# Patient Record
Sex: Male | Born: 1945 | Race: White | Hispanic: Yes | Marital: Married | Smoking: Never smoker
Health system: Southern US, Community
[De-identification: ages and names within clinical notes are randomized; demographics above are authoritative.]

---

## 2021-09-16 ENCOUNTER — Other Ambulatory Visit: Payer: Self-pay

## 2021-09-16 ENCOUNTER — Ambulatory Visit: Payer: 59 | Admitting: Physician Assistant

## 2021-09-16 VITALS — BP 131/80 | HR 74 | Wt 152.0 lb

## 2021-09-16 DIAGNOSIS — F0150 Vascular dementia without behavioral disturbance: Secondary | ICD-10-CM | POA: Diagnosis not present

## 2021-09-16 DIAGNOSIS — R413 Other amnesia: Secondary | ICD-10-CM

## 2021-09-16 DIAGNOSIS — F0280 Dementia in other diseases classified elsewhere without behavioral disturbance: Secondary | ICD-10-CM | POA: Insufficient documentation

## 2021-09-16 DIAGNOSIS — G309 Alzheimer's disease, unspecified: Secondary | ICD-10-CM | POA: Diagnosis not present

## 2021-09-16 MED ORDER — MEMANTINE HCL 10 MG PO TABS: 10.0000 mg | ORAL_TABLET | Freq: Two times a day (BID) | ORAL | 11 refills | Status: DC

## 2021-09-16 NOTE — Progress Notes (Signed)
Assessment/Plan:   Kenneth Nixon is a very pleasant 76 y.o. year old RH male with  a history of hypertension, adenocarcinoma of the prostate, PAF, hyperlipidemia,seen today for evaluation of memory loss. MoCA today is 14/30 with deficiencies in most areas, delayed recall 0/5.  Orientation 2/6.  Visuospatial executive 0/5.  The patient recently moved from [place] to here already carrying a diagnosis of dementia for the last 4 years.  No details of his dementia are available for review.  He was on memantine 10 mg daily until now.   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  Discussed the importance of regular daily schedule to maintain brain function.  Continue to monitor mood with PCP.  Recommend establishing appointments with cardiologist and with urology for continuation of care by his PCP Stay active at least 30 minutes at least 3 times a week.  Naps should be scheduled and should be no longer than 60 minutes and should not occur after 2 PM.  Control cardiovascular risk factors  Mediterranean diet is recommended  Folllow up in 3 months  Subjective:    The patient is seen in neurologic consultation at the request of Stana Bunting, PA-C for the evaluation of memory.  The patient is accompanied by his wife who supplements the history. This is a 76 y.o. year old RH  male who recently moved in December 2022 from [place] to live with his son.he carries a diagnosis of dementia for at least 2 years.  Initially he reports that his memory problems become around 40 years ago, when he was having difficulties remembering conversations or recent events.  His memory issues became worse 2 years ago, for which  his neurologist started him on memantine 10 mg daily, which he tolerated well.  Since arriving here, his son noticed worsening of the patient's memory.  His wife states that he repeats himself frequently, and has shown decrease organization skills.  "He used to be very organized, but over the last 2 years this is less ".  He denies being disoriented when walking into the room, or leaving objects in unusual places.  He ambulates without difficulty, denies any recent falls or head injuries.  He no longer drives.  His mood t is good, denies depression, but at times becomes very anxious, missing [place].  He likes to read extensively especially before going to sleep.  He sleeps well, his wife states that sometimes he talks in his sleep, but there is no other REM behavior, or sleepwalking.  No hallucinations.  She states that although no frank paranoia, he has a fear about being alone.  There are no hygiene concerns, he enjoys bathing twice a day.  However, he needs to be told what goals first when he has to get dressed.  He is wife prepares the medications in a pillbox.  They do the finances together.  His appetite is good, denies trouble swallowing.  He used to cook, but he no longer does so.  He does not use the stove, but he does leave the fridge open on the faucet on especially over the last 6 months.  Denies headaches, double vision, dizziness, focal numbness or tingling, unilateral weakness, tremors or anosmia. No history of seizures.  He reports  some urge incontinence, denies constipation or diarrhea. Denies OSA, ETOH or Tobacco. Family History remarkable for paternal and with Alzheimer's disease at 74.    No Known Allergies  Current Outpatient Medications  Medication Instructions   memantine (NAMENDA) 10 mg, Oral, 2 times daily     VITALS:   Vitals:   09/16/21 1005  BP: 131/80  Pulse: 74  SpO2: 97%  Weight: 152 lb (68.9 kg)   No flowsheet data found.  PHYSICAL EXAM   HEENT:   Normocephalic, atraumatic. The mucous membranes are moist. The superficial temporal arteries are without ropiness or tenderness. Cardiovascular: Regular rate and rhythm. Lungs: Clear to auscultation bilaterally. Neck: There are no carotid bruits noted bilaterally.  NEUROLOGICAL: Montreal Cognitive Assessment  09/16/2021  Visuospatial/ Executive (0/5) 0  Naming (0/3) 3  Attention: Read list of digits (0/2) 1  Attention: Read list of letters (0/1) 1  Attention: Serial 7 subtraction starting at 100 (0/3) 3  Language: Repeat phrase (0/2) 1  Language : Fluency (0/1) 0  Abstraction (0/2) 1  Delayed Recall (0/5) 2  Orientation (0/6) 2  Total 14  Adjusted Score (based on education) 14   No flowsheet data found.  No flowsheet data found.   Orientation:  Alert and oriented to person not to place or time.  No aphasia or dysarthria. Fund of knowledge is appropriate. Recent memory impaired and remote memory intact.  Attention and concentration are normal.  Able to name objects and repeat phrases. Delayed recall 0/5 Cranial nerves: There is good facial symmetry. Extraocular muscles are intact and visual fields are full to confrontational testing. Speech is fluent and clear. Soft palate rises symmetrically and there is no tongue deviation. Hearing is intact to conversational tone. Tone: Tone is good throughout. Sensation: Sensation is intact to light touch and pinprick throughout. Vibration is intact at the bilateral big toe.There is no extinction with double simultaneous stimulation. There is no sensory dermatomal level identified. Coordination: The patient has no difficulty with RAM's or FNF bilaterally. Normal finger to nose  Motor: Strength is 5/5 in the bilateral upper and lower extremities. There is no pronator drift. There are no fasciculations noted. DTR's: Deep tendon reflexes are 2/4 at the bilateral biceps, triceps, brachioradialis, patella and achilles.  Plantar responses are downgoing  bilaterally. Gait and Station: The patient is able to ambulate without difficulty.The patient is able to heel toe walk without any difficulty.The patient is able to ambulate in a tandem fashion. The patient is able to stand in the Romberg position.     Thank you for allowing [place] the opportunity to participate in the care of this nice patient. Please do not hesitate to contact us for any questions or concerns.   Total time spent on today's visit was 60 minutes, including both face-to-face time and nonface-to-face time.  Time included that spent on review of records (prior notes available to me/labs/imaging if pertinent), discussing treatment and goals, answering patient's questions and coordinating care.  Cc:  Stana Bunting, PA-C  [facility] 09/16/2021 11:25 AM

## 2021-09-16 NOTE — Patient Instructions (Addendum)
It was a pleasure to see you today at our office.   Recommendations:  Meds: Follow up in 3 months  Incremente la dosis de Mematina a 10 mg 2 veces al dia, una a la noche y una durante el dia  MRI del cerebro  Laboratorio  Asegurese de ir a sus especialistas de la prostata y del corazon !!!    RECOMMENDATIONS FOR ALL PATIENTS WITH MEMORY PROBLEMS:  1. Continue to exercise (Recommend 30 minutes of walking everyday, or 3 hours every week) 2. Increase social interactions - continue going to [place] and enjoy social gatherings with friends and family 3. Eat healthy, avoid fried foods and eat more fruits and vegetables 4. Maintain adequate blood pressure, blood sugar, and blood cholesterol level. Reducing the risk of stroke and cardiovascular disease also helps promoting better memory. 5. Avoid stressful situations. Live a simple life and avoid aggravations. Organize your time and prepare for the next day in anticipation. 6. Sleep well, avoid any interruptions of sleep and avoid any distractions in the bedroom that may interfere with adequate sleep quality 7. Avoid sugar, avoid sweets as there is a strong link between excessive sugar intake, diabetes, and cognitive impairment We discussed the Mediterranean diet, which has been shown to help patients reduce the risk of progressive memory disorders and reduces cardiovascular risk. This includes eating fish, eat fruits and green leafy vegetables, nuts like almonds and hazelnuts, walnuts, and also use olive oil. Avoid fast foods and fried foods as much as possible. Avoid sweets and sugar as sugar use has been linked to worsening of memory function.  There is always a concern of gradual progression of memory problems. If this is the case, then we may need to adjust level of care according to patient needs. Support, both to the patient and caregiver, should then be put into place.    The Alzheimers Association is here all day, every day for people  facing Alzheimers disease through our free 24/7 Helpline: [phone]. The Helpline provides reliable information and support to all those who need assistance, such as individuals living with memory loss, Alzheimer's or other dementia, caregivers, health care professionals and the public.  Our highly trained and knowledgeable staff can help you with: Understanding memory loss, dementia and Alzheimer's  Medications and other treatment options  General information about aging and brain health  Skills to provide quality care and to find the best care from professionals  Legal, financial and living-arrangement decisions Our Helpline also features: Confidential care consultation provided by master's level clinicians who can help with decision-making support, crisis assistance and education on issues families face every day  Help in a caller's preferred language using our translation service that features more than 200 languages and dialects  Referrals to local community programs, services and ongoing support     FALL PRECAUTIONS: Be cautious when walking. Scan the area for obstacles that may increase the risk of trips and falls. When getting up in the mornings, sit up at the edge of the bed for a few minutes before getting out of bed. Consider elevating the bed at the head end to avoid drop of blood pressure when getting up. Walk always in a well-lit room (use night lights in the walls). Avoid area rugs or power cords from appliances in the middle of the walkways. Use a walker or a cane if necessary and consider physical therapy for balance exercise. Get your eyesight checked regularly.  FINANCIAL OVERSIGHT: Supervision, especially oversight when making financial decisions  or transactions is also recommended.  HOME SAFETY: Consider the safety of the kitchen when operating appliances like stoves, microwave oven, and blender. Consider having supervision and share cooking responsibilities until no  longer able to participate in those. Accidents with firearms and other hazards in the house should be identified and addressed as well.   ABILITY TO BE LEFT ALONE: If patient is unable to contact 911 operator, consider using LifeLine, or when the need is there, arrange for someone to stay with patients. Smoking is a fire hazard, consider supervision or cessation. Risk of wandering should be assessed by caregiver and if detected at any point, supervision and safe proof recommendations should be instituted.  MEDICATION SUPERVISION: Inability to self-administer medication needs to be constantly addressed. Implement a mechanism to ensure safe administration of the medications.   DRIVING: Regarding driving, in patients with progressive memory problems, driving will be impaired. We advise to have someone else do the driving if trouble finding directions or if minor accidents are reported. Independent driving assessment is available to determine safety of driving.   If you are interested in the driving assessment, you can contact the following:  [employer] in [place]  [place] [phone] or [phone]      [place] refers to food and lifestyle choices that are based on the traditions of countries located on the [employer]. This way of eating has been shown to help prevent certain conditions and improve outcomes for people who have chronic diseases, like kidney disease and heart disease. What are tips for following this plan? Lifestyle  Cook and eat meals together with your family, when possible. Drink enough fluid to keep your urine clear or pale yellow. Be physically active every day. This includes: Aerobic exercise like running or swimming. Leisure activities like gardening, walking, or housework. Get 7-8 hours of sleep each  night. If recommended by your health care provider, drink red wine in moderation. This means 1 glass a day for nonpregnant women and 2 glasses a day for men. A glass of wine equals 5 oz (150 mL). Reading food labels  Check the serving size of packaged foods. For foods such as rice and pasta, the serving size refers to the amount of cooked product, not dry. Check the total fat in packaged foods. Avoid foods that have saturated fat or trans fats. Check the ingredients list for added sugars, such as corn syrup. Shopping  At the grocery store, buy most of your food from the areas near the walls of the store. This includes: Fresh fruits and vegetables (produce). Grains, beans, nuts, and seeds. Some of these may be available in unpackaged forms or large amounts (in bulk). Fresh seafood. Poultry and eggs. Low-fat dairy products. Buy whole ingredients instead of prepackaged foods. Buy fresh fruits and vegetables in-season from local farmers markets. Buy frozen fruits and vegetables in resealable bags. If you do not have access to quality fresh seafood, buy precooked frozen shrimp or canned fish, such as tuna, salmon, or sardines. Buy small amounts of raw or cooked vegetables, salads, or olives from the deli or salad bar at your store. Stock your pantry so you always have certain foods on hand, such as olive oil, canned tuna, canned tomatoes, rice, pasta, and beans. Cooking  Cook foods with extra-virgin olive oil instead of using butter or other vegetable oils. Have meat as a side dish, and have vegetables  or grains as your main dish. This means having meat in small portions or adding small amounts of meat to foods like pasta or stew. Use beans or vegetables instead of meat in common dishes like chili or lasagna. Experiment with different cooking methods. Try roasting or broiling vegetables instead of steaming or sauteing them. Add frozen vegetables to soups, stews, pasta, or rice. Add nuts or seeds  for added healthy fat at each meal. You can add these to yogurt, salads, or vegetable dishes. Marinate fish or vegetables using olive oil, lemon juice, garlic, and fresh herbs. Meal planning  Plan to eat 1 vegetarian meal one day each week. Try to work up to 2 vegetarian meals, if possible. Eat seafood 2 or more times a week. Have healthy snacks readily available, such as: Vegetable sticks with hummus. Greek yogurt. Fruit and nut trail mix. Eat balanced meals throughout the week. This includes: Fruit: 2-3 servings a day Vegetables: 4-5 servings a day Low-fat dairy: 2 servings a day Fish, poultry, or lean meat: 1 serving a day Beans and legumes: 2 or more servings a week Nuts and seeds: 1-2 servings a day Whole grains: 6-8 servings a day Extra-virgin olive oil: 3-4 servings a day Limit red meat and sweets to only a few servings a month What are my food choices? Mediterranean diet Recommended Grains: Whole-grain pasta. Brown rice. Bulgar wheat. Polenta. Couscous. Whole-wheat bread. [place]. Vegetables: Artichokes. Beets. Broccoli. Cabbage. Carrots. Eggplant. Green beans. Chard. Kale. Spinach. Onions. Leeks. Peas. Squash. Tomatoes. Peppers. Radishes. Fruits: Apples. Apricots. Avocado. Berries. Bananas. Cherries. Dates. Figs. Grapes. Lemons. Melon. Oranges. Peaches. Plums. Pomegranate. Meats and other protein foods: Beans. Almonds. Sunflower seeds. Pine nuts. Peanuts. [place]. Salmon. Scallops. Shrimp. [place]. Tilapia. Clams. Oysters. Eggs. Dairy: Low-fat milk. Cheese. Greek yogurt. Beverages: Water. Red wine. Herbal tea. Fats and oils: Extra virgin olive oil. Avocado oil. Grape seed oil. Sweets and desserts: [place] yogurt with honey. Baked apples. Poached pears. Trail mix. Seasoning and other foods: Basil. Cilantro. Coriander. Cumin. Mint. Parsley. Sage. Rosemary. Tarragon. Garlic. Oregano. Thyme. Pepper. Balsalmic vinegar. Tahini. Hummus. Tomato sauce. Olives. Mushrooms. Limit  these Grains: Prepackaged pasta or rice dishes. Prepackaged cereal with added sugar. Vegetables: Deep fried potatoes (french fries). Fruits: Fruit canned in syrup. Meats and other protein foods: Beef. Pork. Lamb. Poultry with skin. Hot dogs. Berniece Salines. Dairy: Ice cream. Sour cream. Whole milk. Beverages: Juice. Sugar-sweetened soft drinks. Beer. Liquor and spirits. Fats and oils: Butter. Canola oil. Vegetable oil. Beef fat (tallow). Lard. Sweets and desserts: Cookies. Cakes. Pies. Candy. Seasoning and other foods: Mayonnaise. Premade sauces and marinades. The items listed may not be a complete list. Talk with your dietitian about what dietary choices are right for you. Summary The Mediterranean diet includes both food and lifestyle choices. Eat a variety of fresh fruits and vegetables, beans, nuts, seeds, and whole grains. Limit the amount of red meat and sweets that you eat. Talk with your health care provider about whether it is safe for you to drink red wine in moderation. This means 1 glass a day for nonpregnant women and 2 glasses a day for men. A glass of wine equals 5 oz (150 mL). This information is not intended to replace advice given to you by your health care provider. Make sure you discuss any questions you have with your health care provider. Document Released: 03/06/2016 Document Revised: 04/08/2016 Document Reviewed: 03/06/2016 Elsevier Interactive Patient Education  2017 [employer].

## 2021-09-17 ENCOUNTER — Other Ambulatory Visit (HOSPITAL_BASED_OUTPATIENT_CLINIC_OR_DEPARTMENT_OTHER): Payer: Self-pay | Admitting: Physician Assistant

## 2021-09-17 DIAGNOSIS — M810 Age-related osteoporosis without current pathological fracture: Secondary | ICD-10-CM

## 2021-09-26 ENCOUNTER — Other Ambulatory Visit (HOSPITAL_BASED_OUTPATIENT_CLINIC_OR_DEPARTMENT_OTHER): Payer: 59

## 2021-09-30 ENCOUNTER — Ambulatory Visit (HOSPITAL_BASED_OUTPATIENT_CLINIC_OR_DEPARTMENT_OTHER)
Admission: RE | Admit: 2021-09-30 | Discharge: 2021-09-30 | Disposition: A | Discharge status: Home / Self Care | Payer: 59 | Source: Ambulatory Visit | Attending: Physician Assistant | Admitting: Physician Assistant

## 2021-09-30 ENCOUNTER — Other Ambulatory Visit: Payer: Self-pay

## 2021-09-30 DIAGNOSIS — M810 Age-related osteoporosis without current pathological fracture: Secondary | ICD-10-CM | POA: Insufficient documentation

## 2021-10-11 ENCOUNTER — Telehealth: Payer: Self-pay

## 2021-10-11 NOTE — Telephone Encounter (Signed)
NOES SCANNED TO REFERRAL ?

## 2021-10-18 ENCOUNTER — Ambulatory Visit: Payer: 59 | Admitting: Internal Medicine

## 2021-10-31 ENCOUNTER — Ambulatory Visit (INDEPENDENT_AMBULATORY_CARE_PROVIDER_SITE_OTHER): Payer: 59

## 2021-10-31 ENCOUNTER — Encounter: Payer: Self-pay | Admitting: *Deleted

## 2021-10-31 ENCOUNTER — Ambulatory Visit: Payer: 59 | Admitting: Internal Medicine

## 2021-10-31 ENCOUNTER — Encounter: Payer: Self-pay | Admitting: Internal Medicine

## 2021-10-31 VITALS — BP 136/76 | HR 68 | Ht 69.29 in | Wt 156.0 lb

## 2021-10-31 DIAGNOSIS — I4891 Unspecified atrial fibrillation: Secondary | ICD-10-CM | POA: Diagnosis not present

## 2021-10-31 NOTE — Progress Notes (Unsigned)
Enrolled for [employer] to mail a ZIO XT long term holter monitor to the patients address on file.  ?Letter in [place] with instructions and self pay discounted rate information mailed to patient. ?

## 2021-10-31 NOTE — Patient Instructions (Signed)
Medication Instructions:  ?No Changes In Medications at this time.  ?*If you need a refill on your cardiac medications before your next appointment, please call your pharmacy* ? ?Testing/Procedures: ?Your physician has requested that you have an echocardiogram. Echocardiography is a painless test that uses sound waves to create images of your heart. It provides your doctor with information about the size and shape of your heart and how well your heart?s chambers and valves are working. You may receive an ultrasound enhancing agent through an IV if needed to better visualize your heart during the echo.This procedure takes approximately one hour. There are no restrictions for this procedure. This will take place at the [address], [address].  ? ? ?ZIO XT- Long Term Monitor Instructions  ? ?Your physician has requested you wear your ZIO patch monitor___7____days.  ? ?This is a single patch monitor.  [employer] supplies one patch monitor per enrollment.  Additional stickers are not available. ?  ?Please do not apply patch if you will be having a Nuclear Stress Test, Echocardiogram, Cardiac CT, MRI, or Chest Xray during the time frame you would be wearing the monitor. The patch cannot be worn during these tests.  You cannot remove and re-apply the ZIO XT patch monitor. ?  ?Your ZIO patch monitor will be sent USPS Priority mail from [facility] directly to your home address. The monitor may also be mailed to a PO BOX if home delivery is not available.   It may take 3-5 days to receive your monitor after you have been enrolled. ?  ?Once you have received you monitor, please review enclosed instructions.  Your monitor has already been registered assigning a specific monitor serial # to you. ?  ?Applying the monitor  ? ?Shave hair from upper left chest. ?  ?Hold abrader disc by orange tab.  Rub abrader in 40 strokes over left upper chest as indicated in your monitor instructions. ?  ?Clean area with 4 enclosed  alcohol pads .  Use all pads to assure are is cleaned thoroughly.  Let dry.  ? ?Apply patch as indicated in monitor instructions.  Patch will be place under collarbone on left side of chest with arrow pointing upward. ?  ?Rub patch adhesive wings for 2 minutes.Remove white label marked "1".  Remove white label marked "2".  Rub patch adhesive wings for 2 additional minutes. ?  ?While looking in a mirror, press and release button in center of patch.  A small green light will flash 3-4 times .  This will be your only indicator the monitor has been turned on. ?    ?Do not shower for the first 24 hours.  You may shower after the first 24 hours. ?  ?Press button if you feel a symptom. You will hear a small click.  Record Date, Time and Symptom in the Patient Log Book. ?  ?When you are ready to remove patch, follow instructions on last 2 pages of Patient Log Book.  Stick patch monitor onto last page of Patient Log Book. ?  ?Place Patient Log Book in [facility] box.  Use locking tab on box and tape box closed securely.  The [employer] has [employer] on it.  Please place in mailbox as soon as possible.  Your physician should have your test results approximately 7 days after the monitor has been mailed back to [facility]. ?  ?Call [facility] at [phone] if you have questions regarding your  ZIO XT patch monitor.  Call them immediately if you see an orange light blinking on your monitor. ?  ?If your monitor falls off in less than 4 days contact our Monitor department at [phone].  If your monitor becomes loose or falls off after 4 days call [employer] at [phone] for suggestions on securing your monitor.  ? ? ?Follow-Up: ?At [facility], you and your health needs are our priority.  As part of our continuing mission to provide you with exceptional heart care, we have created designated Provider Care Teams.  These Care Teams include your primary Cardiologist (physician) and Advanced  Practice Providers (APPs -  Physician Assistants and Nurse Practitioners) who all work together to provide you with the care you need, when you need it. ? ?We recommend signing up for the patient portal called "MyChart".  Sign up information is provided on this After Visit Summary.  MyChart is used to connect with patients for Virtual Visits (Telemedicine).  Patients are able to view lab/test results, encounter notes, upcoming appointments, etc.  Non-urgent messages can be sent to your provider as well.   ?To learn more about what you can do with MyChart, go to [email or web address].   ? ?Your next appointment:   ?3 month(s) ? ?The format for your next appointment:   ?In Person ? ?Provider:   ?Janina Mayo, MD  ?

## 2021-10-31 NOTE — Progress Notes (Signed)
?Cardiology Office Note:   ? ?Date:  10/31/2021  ? ?ID:  Kenneth Nixon, DOB Dec 26, 1945, MRN [id number] ? ?PCP:  Stana Bunting, PA-C ?  ?[facility] Providers ?Cardiologist:  Janina Mayo, MD    ? ?Referring MD: Stana Bunting, PA-C  ? ?No chief complaint on file. ?Atrial Fibrillation ? ?History of Present Illness:   ? ?Kenneth Nixon is a 76 y.o. male with a hx of HTN, HLD, referral for  atrial fibrillation ? ?[name] recently moved from [place]. It's been 3 months.  She has hx of dementia. She was diagnosed with atrial fibrillation in [place]. She was taking amiodarone 200 mg ?every other day. He was noted to be in rate controlled afib there. He was started on eliquis 5 mg BID. He is taking the every 2 days. He was diagnosed 3 years ago.  He felt his heart racing. He has had no cardiac procedures. He denies SOB or palpitations. He has not had an echo. No known thyroid disease. No apnea. No orthopnea or PND. He denies dyspnea on exertion or chest pain with exertion.  He is active and he walks all the time. No LE edema. No stress test.  ? ?TSH is normal. ? ?Current Medications: ?Current Outpatient Medications on File Prior to Visit  ?Medication Sig Dispense Refill  ? amiodarone (PACERONE) 200 MG tablet Take 200 mg by mouth daily.    ? amLODipine (NORVASC) 5 MG tablet Take 5 mg by mouth daily.    ? apixaban (ELIQUIS) 5 MG TABS tablet Take 5 mg by mouth 2 (two) times daily.    ? Ascorbic Acid (VITAMIN C) 100 MG tablet Take 100 mg by mouth daily.    ? Cholecalciferol (VITAMIN D3 PO) Take by mouth.    ? Glucosamine HCl (GLUCOSAMINE PO) Take by mouth.    ? memantine (NAMENDA) 10 MG tablet Take 1 tablet (10 mg total) by mouth 2 (two) times daily. 60 tablet 11  ? ?No current facility-administered medications on file prior to visit.  ? ? ?Allergies:   Patient has no known allergies.  ? ?Social History  ? ?Socioeconomic History  ? Marital status: Married  ?  Spouse name: Not on  file  ? Number of children: Not on file  ? Years of education: Not on file  ? Highest education level: Not on file  ?Occupational History  ? Not on file  ?Tobacco Use  ? Smoking status: Not on file  ? Smokeless tobacco: Not on file  ?Substance and Sexual Activity  ? Alcohol use: Not on file  ? Drug use: Not on file  ? Sexual activity: Not on file  ?Other Topics Concern  ? Not on file  ?Social History Narrative  ? Not on file  ? ?Social Determinants of Health  ? ?Financial Resource Strain: Not on file  ?Food Insecurity: Not on file  ?Transportation Needs: Not on file  ?Physical Activity: Not on file  ?Stress: Not on file  ?Social Connections: Not on file  ?  ? ?Family History: ?The patient's reviewed not pertinent ? ?ROS:   ?Please see the history of present illness.    ? All other systems reviewed and are negative. ? ?EKGs/Labs/Other Studies Reviewed:   ? ?The following studies were reviewed today: ? ? ?EKG:  EKG is  ordered today.  The ekg ordered today demonstrates  ? ?10/31/2021- EKG NSR, PACs ? ? ?Recent Labs: ?No results found for requested labs within last 8760 hours.  ?Recent  Lipid Panel ?No results found for: CHOL, TRIG, HDL, CHOLHDL, VLDL, LDLCALC, LDLDIRECT ? ? ?Risk Assessment/Calculations:   ? ?CHA2DS2-VASc Score = 3  ? This indicates a 3.2% annual risk of stroke. ?The patient's score is based upon: ?CHF History: 0 ?HTN History: 1 ?Diabetes History: 0 ?Stroke History: 0 ?Vascular Disease History: 0 ?Age Score: 2 ?Gender Score: 0 ?  ? ? ?    ? ?Physical Exam:   ? ?VS:  ?  ?Vitals:  ? 10/31/21 1009  ?BP: 136/76  ?Pulse: 68  ?SpO2: 97%  ? ? ? ? BP 136/76   Pulse 68   Ht 5' 9.29" (1.76 m)   Wt 156 lb (70.8 kg)   SpO2 97%   BMI 22.84 kg/m?    ? ?Wt Readings from Last 3 Encounters:  ?10/31/21 156 lb (70.8 kg)  ?09/16/21 152 lb (68.9 kg)  ?  ? ?GEN:  Well nourished, well developed in no acute distress ?HEENT: Normal ?NECK: No JVD; No carotid bruits ?LYMPHATICS: No lymphadenopathy ?CARDIAC: RRR, no murmurs,  rubs, gallops ?RESPIRATORY:  Clear to auscultation without rales, wheezing or rhonchi  ?ABDOMEN: Soft, non-tender, non-distended ?MUSCULOSKELETAL:  No edema; No deformity  ?SKIN: Warm and dry ?NEUROLOGIC:  Alert and oriented x 3 ?PSYCHIATRIC:  Normal affect  ? ?ASSESSMENT:   ? ?Atrial Fibrillation: CHADS2VASC=3; reported hx in [place]. I don't have any tracing. Will plan for cardiac event monitor. He is asymptomatic. No sleep apnea. No lung disease. Normal liver function.  Normal renal function. Will obtain TTE to assess LV function/LA size. Will continue amiodarone for now.   ?- taking amiodarone 200 mg every other day ?- continue eliquis 5 mg BID  ?- on FU plan for CMP to assess  ? ?HTN- Good control. continue amlodipine 5 mg daily ? ?HLD- considering his age of 49; no strict guidelines after 78 years of age according to the USPSTF . There is data to support reduction in cardiac events, however he is low risk. Can defer statin therapy ? ?PLAN:   ? ?In order of problems listed above: ? ?TTE ?7 day ziopatch  ?Follow up 3 months ? ?   ? ?Medication Adjustments/Labs and Tests Ordered: ?Current medicines are reviewed at length with the patient today.  Concerns regarding medicines are outlined above.  ?Orders Placed This Encounter  ?Procedures  ? LONG TERM MONITOR (3-14 DAYS)  ? EKG 12-Lead  ? ECHOCARDIOGRAM COMPLETE  ? ?No orders of the defined types were placed in this encounter. ? ? ?Patient Instructions  ?Medication Instructions:  ?No Changes In Medications at this time.  ?*If you need a refill on your cardiac medications before your next appointment, please call your pharmacy* ? ?Testing/Procedures: ?Your physician has requested that you have an echocardiogram. Echocardiography is a painless test that uses sound waves to create images of your heart. It provides your doctor with information about the size and shape of your heart and how well your heart?s chambers and valves are working. You may receive an  ultrasound enhancing agent through an IV if needed to better visualize your heart during the echo.This procedure takes approximately one hour. There are no restrictions for this procedure. This will take place at the [address], [address].  ? ? ?ZIO XT- Long Term Monitor Instructions  ? ?Your physician has requested you wear your ZIO patch monitor___7____days.  ? ?This is a single patch monitor.  [employer] supplies one patch monitor per enrollment.  Additional stickers are not available. ?  ?  Please do not apply patch if you will be having a Nuclear Stress Test, Echocardiogram, Cardiac CT, MRI, or Chest Xray during the time frame you would be wearing the monitor. The patch cannot be worn during these tests.  You cannot remove and re-apply the ZIO XT patch monitor. ?  ?Your ZIO patch monitor will be sent USPS Priority mail from [facility] directly to your home address. The monitor may also be mailed to a PO BOX if home delivery is not available.   It may take 3-5 days to receive your monitor after you have been enrolled. ?  ?Once you have received you monitor, please review enclosed instructions.  Your monitor has already been registered assigning a specific monitor serial # to you. ?  ?Applying the monitor  ? ?Shave hair from upper left chest. ?  ?Hold abrader disc by orange tab.  Rub abrader in 40 strokes over left upper chest as indicated in your monitor instructions. ?  ?Clean area with 4 enclosed alcohol pads .  Use all pads to assure are is cleaned thoroughly.  Let dry.  ? ?Apply patch as indicated in monitor instructions.  Patch will be place under collarbone on left side of chest with arrow pointing upward. ?  ?Rub patch adhesive wings for 2 minutes.Remove white label marked "1".  Remove white label marked "2".  Rub patch adhesive wings for 2 additional minutes. ?  ?While looking in a mirror, press and release button in center of patch.  A small green light will flash 3-4 times .  This will be  your only indicator the monitor has been turned on. ?    ?Do not shower for the first 24 hours.  You may shower after the first 24 hours. ?  ?Press button if you feel a symptom. You will hear a small click.  Record Da

## 2021-11-08 ENCOUNTER — Ambulatory Visit (HOSPITAL_COMMUNITY): Payer: 59 | Attending: Internal Medicine

## 2021-11-08 DIAGNOSIS — I4891 Unspecified atrial fibrillation: Secondary | ICD-10-CM | POA: Insufficient documentation

## 2021-11-08 LAB — ECHOCARDIOGRAM COMPLETE
Area-P 1/2: 2.45 cm2
P 1/2 time: 714 msec
S' Lateral: 3.5 cm

## 2021-11-10 DIAGNOSIS — I4891 Unspecified atrial fibrillation: Secondary | ICD-10-CM | POA: Diagnosis not present

## 2021-11-12 ENCOUNTER — Encounter: Payer: Self-pay | Admitting: *Deleted

## 2021-12-11 ENCOUNTER — Encounter: Payer: Self-pay | Admitting: *Deleted

## 2021-12-16 ENCOUNTER — Ambulatory Visit: Payer: 59 | Admitting: Physician Assistant

## 2021-12-16 ENCOUNTER — Encounter: Payer: Self-pay | Admitting: Physician Assistant

## 2021-12-16 VITALS — BP 129/82 | HR 89 | Resp 20 | Ht 68.0 in | Wt 156.0 lb

## 2021-12-16 DIAGNOSIS — F0150 Vascular dementia without behavioral disturbance: Secondary | ICD-10-CM | POA: Diagnosis not present

## 2021-12-16 DIAGNOSIS — G309 Alzheimer's disease, unspecified: Secondary | ICD-10-CM

## 2021-12-16 DIAGNOSIS — F0280 Dementia in other diseases classified elsewhere without behavioral disturbance: Secondary | ICD-10-CM | POA: Diagnosis not present

## 2021-12-16 NOTE — Progress Notes (Signed)
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 Last [facility] 08/29/21 was 14/30. Placed on memantine 10 mg bid, tolerating well.  MRI brain has not been performed to date due to insurance issues .    Recommendations:    Continue  10 mg   Side effects were discussed Recommend MRI of the brain once approved by insurance for evaluation of the structures of the brain and vascular load. Follow up in 6 months.   Case discussed with Dr. Delice Lesch who agrees with the plan    Subjective:    Kenneth Nixon is a very pleasant 76 y.o. RH male  seen today in follow up for memory loss. This patient is accompanied in the office by his wife who supplements the history.  Previous records as well as any outside records available were reviewed prior to todays visit.  Patient was last seen at our office on 08/29/2021 at which time his MoCA was 14/30. Patient is currently on memantine 10 mg twice daily, tolerating well.  Any changes in memory since last visit?  Patient denies any changes, his wife agrees.  However, his wife reports that he is "calendar than before, more pleasant". He likes to do crossword puzzles in Spanish and jigsaw puzzles with wife. He is looking into a Designer, multimedia for activities, he will be in touch with his doctor at [facility] "maybe they know" (Spanish Speaking)  Patient lives with: Spouse repeats oneself?  "Very frequently ", his wife says. Disoriented when walking into a room?  Patient denies   Leaving objects in unusual places?  Patient denies   Ambulates  with difficulty?   Patient denies   Recent falls?  Patient denies   Any head injuries?  Patient denies   History of seizures?   Patient denies   Wandering behavior?  Patient denies   Patient drives? He no longer drives  "because I do not have my license since I came to the [place] ". Any mood changes such irritability agitation?  Patient denies.  As mentioned before, he is calmer since starting memantine Any history of depression?:  Patient denies   Hallucinations?  Patient denies   Paranoia?  Patient denies   Patient reports that he sleeps well without vivid dreams, REM behavior or sleepwalking History of sleep apnea?  Patient denies   Any hygiene concerns?  Patient denies   Independent of bathing and dressing?  Endorsed  Does the patient needs help with medications?  Wife in charge  Who is in charge of the finances?  Son is in charge  Any changes in appetite?  Patient denies   Patient have trouble swallowing? Patient denies   Does the patient cook?  Patient denies   Any kitchen accidents such as leaving the stove on? Patient denies   Any headaches?  Patient denies   The double vision? Patient denies   Any focal numbness or tingling?  Patient denies   Chronic back pain Patient denies   Unilateral weakness?  Patient denies   Any tremors?  Patient denies   Any history of anosmia?  Patient denies   Any incontinence of urine?  Patient denies   Any bowel dysfunction?   Patient denies      Initial Visit 08/29/21 The patient is seen in neurologic consultation at  the request of Kenneth Bunting, PA-C for the evaluation of memory.  The patient is accompanied by his wife who supplements the history. This is a 76 y.o. year old RH  male who recently moved in December 2022 from [place] to live with his son.he carries a diagnosis of dementia for at least 2 years.  Initially he reports that his memory problems become around 40 years ago, when he was having difficulties remembering conversations or recent events.  His memory issues became worse 2 years ago, for which his neurologist started him on memantine 10 mg daily, which he tolerated well.  Since arriving here, his son noticed worsening of the patient's memory.  His wife  states that he repeats himself frequently, and has shown decrease organization skills.  "He used to be very organized, but over the last 2 years this is less ".  He denies being disoriented when walking into the room, or leaving objects in unusual places.  He ambulates without difficulty, denies any recent falls or head injuries.  He no longer drives.  His mood t is good, denies depression, but at times becomes very anxious, missing [place].  He likes to read extensively especially before going to sleep.  He sleeps well, his wife states that sometimes he talks in his sleep, but there is no other REM behavior, or sleepwalking.  No hallucinations.  She states that although no frank paranoia, he has a fear about being alone.  There are no hygiene concerns, he enjoys bathing twice a day.  However, he needs to be told what goals first when he has to get dressed.  He is wife prepares the medications in a pillbox.  They do the finances together.  His appetite is good, denies trouble swallowing.  He used to cook, but he no longer does so.  He does not use the stove, but he does leave the fridge open on the faucet on especially over the last 6 months.  Denies headaches, double vision, dizziness, focal numbness or tingling, unilateral weakness, tremors or anosmia. No history of seizures.  He reports some urge incontinence, denies constipation or diarrhea. Denies OSA, ETOH or Tobacco. Family History remarkable for paternal and with Alzheimer's disease at 17.  PREVIOUS MEDICATIONS:   CURRENT MEDICATIONS:  Outpatient Encounter Medications as of 12/16/2021  Medication Sig   amiodarone (PACERONE) 200 MG tablet Take 200 mg by mouth daily.   amLODipine (NORVASC) 5 MG tablet Take 5 mg by mouth daily.   apixaban (ELIQUIS) 5 MG TABS tablet Take 5 mg by mouth 2 (two) times daily.   Ascorbic Acid (VITAMIN C) 100 MG tablet Take 100 mg by mouth daily.   Cholecalciferol (VITAMIN D3 PO) Take by mouth.   Glucosamine HCl  (GLUCOSAMINE PO) Take by mouth.   memantine (NAMENDA) 10 MG tablet Take 1 tablet (10 mg total) by mouth 2 (two) times daily.   No facility-administered encounter medications on file as of 12/16/2021.        View : No data to display.            09/16/2021   10:00 AM  Montreal Cognitive Assessment   Visuospatial/ Executive (0/5) 0  Naming (0/3) 3  Attention: Read list of digits (0/2) 1  Attention: Read list of letters (0/1) 1  Attention: Serial 7 subtraction starting at 100 (0/3) 3  Language: Repeat phrase (0/2) 1  Language : Fluency (0/1) 0  Abstraction (0/2) 1  Delayed Recall (0/5) 2  Orientation (0/6) 2  Total 14  Adjusted Score (based on education) 14    Objective:     PHYSICAL EXAMINATION:    VITALS:   Vitals:   12/16/21 0908  BP: 129/82  Pulse: 89  Resp: 20  Weight: 156 lb (70.8 kg)  Height: '5\' 8"'$  (1.727 m)    GEN:  The patient appears stated age and is in NAD. HEENT:  Normocephalic, atraumatic.   Neurological examination:  General: NAD, well-groomed, appears stated age. Orientation: The patient is alert. Oriented to person, place, not to date  cranial nerves: There is good facial symmetry.The speech is fluent and clear, but he does repeat himself frequently "I like to read books ". No aphasia or dysarthria. Fund of knowledge is appropriate. Recent and remote memory are impaired. Attention and concentration are reduced.  Able to name objects and repeat phrases.  Hearing is intact to conversational tone.    Sensation: Sensation is intact to light touch throughout Motor: Strength is at least antigravity x4. Tremors: none  DTR's 2/4 in UE/LE     Movement examination: Tone: There is normal tone in the UE/LE Abnormal movements:  no tremor.  No myoclonus.  No asterixis.   Coordination:  There is no decremation with RAM's. Normal finger to nose  Gait and Station: The patient has no difficulty arising out of a deep-seated chair without the use of the hands.  The patient's stride length is good.  Gait is cautious and narrow.    Thank you for allowing [place] the opportunity to participate in the care of this nice patient. Please do not hesitate to contact us for any questions or concerns.   Total time spent on today's visit was 34 minutes dedicated to this patient today, preparing to see patient, examining the patient, ordering tests and/or medications and counseling the patient, documenting clinical information in the EHR or other health record, independently interpreting results and communicating results to the patient/family, discussing treatment and goals, answering patient's questions and coordinating care.  Cc:  Kenneth Bunting, PA-C  Clarise Cruz [facility] 12/16/2021 10:19 AM

## 2021-12-16 NOTE — Patient Instructions (Addendum)
It was a pleasure to see you today at our office.   Recommendations:  Follow up in 6 meses Continue Memantine 10 mg twice daily.Side effects were discussed  Siga in contacto con su medico primario MRI  del cerebro, vamos a repetir la orden y Naval architect a llamar de nuevo.   Whom to call:  Memory  decline, memory medications: Call out office [phone]   For psychiatric meds, mood meds: Please have your primary care physician manage these medications.   Counseling regarding caregiver distress, including caregiver depression, anxiety and issues regarding community resources, adult day care programs, adult living facilities, or memory care questions:   Feel free to contact [facility], Social Worker at [phone]   For assessment of decision of mental capacity and competency:  Call Dr. Anthoney Harada, geriatric psychiatrist at [phone]  For guidance in geriatric dementia issues please call [facility] [phone]  For guidance regarding [facility] and if placement were needed at the facility, contact Arnell Asal, Social Worker tel: [phone]  If you have any severe symptoms of a stroke, or other severe issues such as confusion,severe chills or fever, etc call 911 or go to the ER as you may need to be evaluate further   Feel free to visit Facebook page " [employer]" for tips of how to care for people with memory problems.   Feel free to go to the following database for funded clinical studies conducted around the world: http://saunders.com/   https://www.triadclinicaltrials.com/     RECOMMENDATIONS FOR ALL PATIENTS WITH MEMORY PROBLEMS: 1. Continue to exercise (Recommend 30 minutes of walking everyday, or 3 hours every week) 2. Increase social interactions - continue going to [place] and enjoy social gatherings with friends and family 3. Eat healthy, avoid fried foods and eat more fruits and vegetables 4. Maintain adequate blood  pressure, blood sugar, and blood cholesterol level. Reducing the risk of stroke and cardiovascular disease also helps promoting better memory. 5. Avoid stressful situations. Live a simple life and avoid aggravations. Organize your time and prepare for the next day in anticipation. 6. Sleep well, avoid any interruptions of sleep and avoid any distractions in the bedroom that may interfere with adequate sleep quality 7. Avoid sugar, avoid sweets as there is a strong link between excessive sugar intake, diabetes, and cognitive impairment We discussed the Mediterranean diet, which has been shown to help patients reduce the risk of progressive memory disorders and reduces cardiovascular risk. This includes eating fish, eat fruits and green leafy vegetables, nuts like almonds and hazelnuts, walnuts, and also use olive oil. Avoid fast foods and fried foods as much as possible. Avoid sweets and sugar as sugar use has been linked to worsening of memory function.  There is always a concern of gradual progression of memory problems. If this is the case, then we may need to adjust level of care according to patient needs. Support, both to the patient and caregiver, should then be put into place.    The Alzheimer's Association is here all day, every day for people facing Alzheimer's disease through our free 24/7 Helpline: 209-211-4046. The Helpline provides reliable information and support to all those who need assistance, such as individuals living with memory loss, Alzheimer's or other dementia, caregivers, health care professionals and the public.  Our highly trained and knowledgeable staff can help you with: Understanding memory loss, dementia and Alzheimer's  Medications and other treatment options  General information about aging and brain health  Skills to provide quality care and to find the best care from professionals  Legal, financial and living-arrangement decisions Our Helpline also  features: Confidential care consultation provided by master's level clinicians who can help with decision-making support, crisis assistance and education on issues families face every day  Help in a caller's preferred language using our translation service that features more than 200 languages and dialects  Referrals to local community programs, services and ongoing support     FALL PRECAUTIONS: Be cautious when walking. Scan the area for obstacles that may increase the risk of trips and falls. When getting up in the mornings, sit up at the edge of the bed for a few minutes before getting out of bed. Consider elevating the bed at the head end to avoid drop of blood pressure when getting up. Walk always in a well-lit room (use night lights in the walls). Avoid area rugs or power cords from appliances in the middle of the walkways. Use a walker or a cane if necessary and consider physical therapy for balance exercise. Get your eyesight checked regularly.  FINANCIAL OVERSIGHT: Supervision, especially oversight when making financial decisions or transactions is also recommended.  HOME SAFETY: Consider the safety of the kitchen when operating appliances like stoves, microwave oven, and blender. Consider having supervision and share cooking responsibilities until no longer able to participate in those. Accidents with firearms and other hazards in the house should be identified and addressed as well.   ABILITY TO BE LEFT ALONE: If patient is unable to contact 911 operator, consider using LifeLine, or when the need is there, arrange for someone to stay with patients. Smoking is a fire hazard, consider supervision or cessation. Risk of wandering should be assessed by caregiver and if detected at any point, supervision and safe proof recommendations should be instituted.  MEDICATION SUPERVISION: Inability to self-administer medication needs to be constantly addressed. Implement a mechanism to ensure safe  administration of the medications.   DRIVING: Regarding driving, in patients with progressive memory problems, driving will be impaired. We advise to have someone else do the driving if trouble finding directions or if minor accidents are reported. Independent driving assessment is available to determine safety of driving.   If you are interested in the driving assessment, you can contact the following:  [employer] in [place]  [place] [phone] or [phone]      [place] refers to food and lifestyle choices that are based on the traditions of countries located on the [employer]. This way of eating has been shown to help prevent certain conditions and improve outcomes for people who have chronic diseases, like kidney disease and heart disease. What are tips for following this plan? Lifestyle  Cook and eat meals together with your family, when possible. Drink enough fluid to keep your urine clear or pale yellow. Be physically active every day. This includes: Aerobic exercise like running or swimming. Leisure activities like gardening, walking, or housework. Get 7-8 hours of sleep each night. If recommended by your health care provider, drink red wine in moderation. This means 1 glass a day for nonpregnant women and 2 glasses a day for men. A glass of wine equals 5 oz (150 mL). Reading food labels  Check the serving size of packaged foods. For foods such as rice and pasta, the serving size refers to the amount of cooked product, not dry. Check  the total fat in packaged foods. Avoid foods that have saturated fat or trans fats. Check the ingredients list for added sugars, such as corn syrup. Shopping  At the grocery store, buy most of your food from the areas near the walls of the store. This includes: Fresh fruits and  vegetables (produce). Grains, beans, nuts, and seeds. Some of these may be available in unpackaged forms or large amounts (in bulk). Fresh seafood. Poultry and eggs. Low-fat dairy products. Buy whole ingredients instead of prepackaged foods. Buy fresh fruits and vegetables in-season from local farmers markets. Buy frozen fruits and vegetables in resealable bags. If you do not have access to quality fresh seafood, buy precooked frozen shrimp or canned fish, such as tuna, salmon, or sardines. Buy small amounts of raw or cooked vegetables, salads, or olives from the deli or salad bar at your store. Stock your pantry so you always have certain foods on hand, such as olive oil, canned tuna, canned tomatoes, rice, pasta, and beans. Cooking  Cook foods with extra-virgin olive oil instead of using butter or other vegetable oils. Have meat as a side dish, and have vegetables or grains as your main dish. This means having meat in small portions or adding small amounts of meat to foods like pasta or stew. Use beans or vegetables instead of meat in common dishes like chili or lasagna. Experiment with different cooking methods. Try roasting or broiling vegetables instead of steaming or sauteing them. Add frozen vegetables to soups, stews, pasta, or rice. Add nuts or seeds for added healthy fat at each meal. You can add these to yogurt, salads, or vegetable dishes. Marinate fish or vegetables using olive oil, lemon juice, garlic, and fresh herbs. Meal planning  Plan to eat 1 vegetarian meal one day each week. Try to work up to 2 vegetarian meals, if possible. Eat seafood 2 or more times a week. Have healthy snacks readily available, such as: Vegetable sticks with hummus. Greek yogurt. Fruit and nut trail mix. Eat balanced meals throughout the week. This includes: Fruit: 2-3 servings a day Vegetables: 4-5 servings a day Low-fat dairy: 2 servings a day Fish, poultry, or lean meat: 1 serving a  day Beans and legumes: 2 or more servings a week Nuts and seeds: 1-2 servings a day Whole grains: 6-8 servings a day Extra-virgin olive oil: 3-4 servings a day Limit red meat and sweets to only a few servings a month What are my food choices? Mediterranean diet Recommended Grains: Whole-grain pasta. Brown rice. Bulgar wheat. Polenta. Couscous. Whole-wheat bread. [place]. Vegetables: Artichokes. Beets. Broccoli. Cabbage. Carrots. Eggplant. Green beans. Chard. Kale. Spinach. Onions. Leeks. Peas. Squash. Tomatoes. Peppers. Radishes. Fruits: Apples. Apricots. Avocado. Berries. Bananas. Cherries. Dates. Figs. Grapes. Lemons. Melon. Oranges. Peaches. Plums. Pomegranate. Meats and other protein foods: Beans. Almonds. Sunflower seeds. Pine nuts. Peanuts. [place]. Salmon. Scallops. Shrimp. [place]. Tilapia. Clams. Oysters. Eggs. Dairy: Low-fat milk. Cheese. Greek yogurt. Beverages: Water. Red wine. Herbal tea. Fats and oils: Extra virgin olive oil. Avocado oil. Grape seed oil. Sweets and desserts: [place] yogurt with honey. Baked apples. Poached pears. Trail mix. Seasoning and other foods: Basil. Cilantro. Coriander. Cumin. Mint. Parsley. Sage. Rosemary. Tarragon. Garlic. Oregano. Thyme. Pepper. Balsalmic vinegar. Tahini. Hummus. Tomato sauce. Olives. Mushrooms. Limit these Grains: Prepackaged pasta or rice dishes. Prepackaged cereal with added sugar. Vegetables: Deep fried potatoes (french fries). Fruits: Fruit canned in syrup. Meats and other protein foods: Beef. Pork. Lamb. Poultry with skin. Hot dogs. [place] Salines. Dairy: Ice cream. Sour  cream. Whole milk. Beverages: Juice. Sugar-sweetened soft drinks. Beer. Liquor and spirits. Fats and oils: Butter. Canola oil. Vegetable oil. Beef fat (tallow). Lard. Sweets and desserts: Cookies. Cakes. Pies. Candy. Seasoning and other foods: Mayonnaise. Premade sauces and marinades. The items listed may not be a complete list. Talk with your dietitian about what  dietary choices are right for you. Summary The Mediterranean diet includes both food and lifestyle choices. Eat a variety of fresh fruits and vegetables, beans, nuts, seeds, and whole grains. Limit the amount of red meat and sweets that you eat. Talk with your health care provider about whether it is safe for you to drink red wine in moderation. This means 1 glass a day for nonpregnant women and 2 glasses a day for men. A glass of wine equals 5 oz (150 mL). This information is not intended to replace advice given to you by your health care provider. Make sure you discuss any questions you have with your health care provider. Document Released: 03/06/2016 Document Revised: 04/08/2016 Document Reviewed: 03/06/2016 Elsevier Interactive Patient Education  2017 [employer].

## 2021-12-19 ENCOUNTER — Telehealth: Payer: Self-pay | Admitting: Physician Assistant

## 2021-12-19 NOTE — Telephone Encounter (Signed)
Larene Beach from [facility] called in to let us know they are out of network with the patient's insurance.

## 2021-12-19 NOTE — Telephone Encounter (Signed)
Sent to [facility] due to plan

## 2021-12-29 ENCOUNTER — Ambulatory Visit (INDEPENDENT_AMBULATORY_CARE_PROVIDER_SITE_OTHER): Payer: 59

## 2021-12-29 DIAGNOSIS — F0280 Dementia in other diseases classified elsewhere without behavioral disturbance: Secondary | ICD-10-CM | POA: Diagnosis not present

## 2021-12-29 DIAGNOSIS — G309 Alzheimer's disease, unspecified: Secondary | ICD-10-CM

## 2021-12-31 NOTE — Progress Notes (Signed)
Please inform patient that the MRI brain just shows some changes from blood pressure, and age related changes, no acute findings, thanks

## 2022-02-04 NOTE — Progress Notes (Unsigned)
Cardiology Office Note:    Date:  02/04/2022   ID:  Kenneth Nixon, DOB 07-Mar-1946, MRN [id number]  PCP:  Kenneth Bunting, PA-C   [facility] Providers Cardiologist:  Kenneth Mayo, MD     Referring MD: Kenneth Bunting, PA-C   No chief complaint on file. Atrial Fibrillation  History of Present Illness:    Kenneth Nixon is a 76 y.o. male with a hx of HTN, HLD, referral for  atrial fibrillation  Kenneth Nixon recently moved from [place]. It's been 3 months.  She has hx of dementia. She was diagnosed with atrial fibrillation in [place]. She was taking amiodarone 200 mg every other day. He was noted to be in rate controlled afib there. He was started on eliquis 5 mg BID. He is taking the every 2 days. He was diagnosed 3 years ago.  He felt his heart racing. He has had no cardiac procedures. He denies SOB or palpitations. He has not had an echo. No known thyroid disease. No apnea. No orthopnea or PND. He denies dyspnea on exertion or chest pain with exertion.  He is active and he walks all the time. No LE edema. No stress test.   TSH is normal.  Interim Hx: [place]iop[place] showing 35% burden afib. He's continued on amiodarone and eliquis. He denies dyspnea. He has vertigo. He snores, no apnea. No orthopnea or PND.  Current Medications: Current Outpatient Medications on File Prior to Visit  Medication Sig Dispense Refill   amiodarone (PACERONE) 200 MG tablet Take 200 mg by mouth daily.     amLODipine (NORVASC) 5 MG tablet Take 5 mg by mouth daily.     apixaban (ELIQUIS) 5 MG TABS tablet Take 5 mg by mouth 2 (two) times daily.     Ascorbic Acid (VITAMIN C) 100 MG tablet Take 100 mg by mouth daily.     Cholecalciferol (VITAMIN D3 PO) Take by mouth.     Glucosamine HCl (GLUCOSAMINE PO) Take by mouth.     memantine (NAMENDA) 10 MG tablet Take 1 tablet (10 mg total) by mouth 2 (two) times daily. 60 tablet 11   No current facility-administered medications on file prior to visit.     Allergies:   Patient has no known allergies.   Social History   Socioeconomic History   Marital status: Married    Spouse name: Not on file   Number of children: 5   Years of education: 12   Highest education level: Not on file  Occupational History   Not on file  Tobacco Use   Smoking status: Never   Smokeless tobacco: Never  Substance and Sexual Activity   Alcohol use: Never   Drug use: Not on file   Sexual activity: Not on file  Other Topics Concern   Not on file  Social History Narrative   Right handed   Drinks caffeine prn   Two story home   Social Determinants of Health   Financial Resource Strain: Not on file  Food Insecurity: Not on file  Transportation Needs: Not on file  Physical Activity: Not on file  Stress: Not on file  Social Connections: Not on file     Family History: The patient's reviewed not pertinent  ROS:   Please see the history of present illness.     All other systems reviewed and are negative.  EKGs/Labs/Other Studies Reviewed:    The following studies were reviewed today:   EKG:  EKG is  ordered today.  The ekg  ordered today demonstrates   10/31/2021- EKG NSR, PACs   Recent Labs: No results found for requested labs within last 365 days.  Recent Lipid Panel No results found for: "CHOL", "TRIG", "HDL", "CHOLHDL", "VLDL", "LDLCALC", "LDLDIRECT"   Risk Assessment/Calculations:    CHA2DS2-VASc Score = 3   This indicates a 3.2% annual risk of stroke. The patient's score is based upon: CHF History: 0 HTN History: 1 Diabetes History: 0 Stroke History: 0 Vascular Disease History: 0 Age Score: 2 Gender Score: 0          Physical Exam:    VS:    Vitals:   02/05/22 0900  BP: 112/70  Pulse: 63  SpO2: 97%     Wt Readings from Last 3 Encounters:  12/16/21 156 lb (70.8 kg)  10/31/21 156 lb (70.8 kg)  09/16/21 152 lb (68.9 kg)     GEN:  Well nourished, well developed in no acute distress HEENT: Normal NECK:  No JVD; No carotid bruits LYMPHATICS: No lymphadenopathy CARDIAC: RRR, no murmurs, rubs, gallops RESPIRATORY:  Clear to auscultation without rales, wheezing or rhonchi  ABDOMEN: Soft, non-tender, non-distended MUSCULOSKELETAL:  No edema; No deformity  SKIN: Warm and dry NEUROLOGIC:  Alert and oriented x 3 PSYCHIATRIC:  Normal affect   ASSESSMENT:    Paroxysmal Atrial Fibrillation: CHADS2VASC=3; diagnosed in [place].  He is asymptomatic. No sleep apnea signs. No lung disease. Normal liver function.  Normal renal function. Echo shows mildly dilated LA. EF closer to 55-60% with normal strain. He has mild to moderate AI with sclerotic valve. Zio showed 35% burden. -  Will continue amiodarone he is taking amiodarone 200 mg every other day  - continue eliquis 5 mg BID   HTN- Good control. continue amlodipine 5 mg daily  HLD- considering his age of 3; no strict guidelines after 76 years of age according to the USPSTF . There is data to support reduction in cardiac events, however he is low risk. Can defer statin therapy  PLAN:    In order of problems listed above:  No changes Follow up 6 months      Medication Adjustments/Labs and Tests Ordered: Current medicines are reviewed at length with the patient today.  Concerns regarding medicines are outlined above.    Signed, Kenneth Mayo, MD  02/04/2022 4:24 PM    [place]

## 2022-02-05 ENCOUNTER — Ambulatory Visit: Payer: 59 | Admitting: Internal Medicine

## 2022-02-05 ENCOUNTER — Encounter: Payer: Self-pay | Admitting: Internal Medicine

## 2022-02-05 VITALS — BP 112/70 | HR 63 | Ht 68.0 in | Wt 155.8 lb

## 2022-02-05 DIAGNOSIS — I4891 Unspecified atrial fibrillation: Secondary | ICD-10-CM | POA: Diagnosis not present

## 2022-02-05 NOTE — Patient Instructions (Signed)
Medication Instructions:  ?No Changes In Medications at this time.  ?*If you need a refill on your cardiac medications before your next appointment, please call your pharmacy* ? ?Lab Work: ?None Ordered At This Time.  ?If you have labs (blood work) drawn today and your tests are completely normal, you will receive your results only by: ?MyChart Message (if you have MyChart) OR ?A paper copy in the mail ?If you have any lab test that is abnormal or we need to change your treatment, we will call you to review the results. ? ?Testing/Procedures: ?None Ordered At This Time.  ? ?Follow-Up: ?At [facility], you and your health needs are our priority.  As part of our continuing mission to provide you with exceptional heart care, we have created designated Provider Care Teams.  These Care Teams include your primary Cardiologist (physician) and Advanced Practice Providers (APPs -  Physician Assistants and Nurse Practitioners) who all work together to provide you with the care you need, when you need it. ? ?Your next appointment:   ?6 month(s) ? ?The format for your next appointment:   ?In Person ? ?Provider:   ?Branch, Mary E, MD   ?

## 2022-02-24 ENCOUNTER — Other Ambulatory Visit (HOSPITAL_BASED_OUTPATIENT_CLINIC_OR_DEPARTMENT_OTHER): Payer: Self-pay | Admitting: Physician Assistant

## 2022-02-24 DIAGNOSIS — M25551 Pain in right hip: Secondary | ICD-10-CM

## 2022-02-24 DIAGNOSIS — M545 Low back pain, unspecified: Secondary | ICD-10-CM

## 2022-04-02 ENCOUNTER — Other Ambulatory Visit (HOSPITAL_COMMUNITY): Payer: Self-pay | Admitting: Urology

## 2022-04-02 DIAGNOSIS — C61 Malignant neoplasm of prostate: Secondary | ICD-10-CM

## 2022-04-03 ENCOUNTER — Other Ambulatory Visit (HOSPITAL_COMMUNITY): Payer: Self-pay | Admitting: Urology

## 2022-04-03 DIAGNOSIS — C61 Malignant neoplasm of prostate: Secondary | ICD-10-CM

## 2022-04-04 ENCOUNTER — Other Ambulatory Visit (HOSPITAL_COMMUNITY): Payer: Self-pay | Admitting: Urology

## 2022-04-04 DIAGNOSIS — C61 Malignant neoplasm of prostate: Secondary | ICD-10-CM

## 2022-04-15 ENCOUNTER — Encounter (HOSPITAL_COMMUNITY)
Admission: RE | Admit: 2022-04-15 | Discharge: 2022-04-15 | Disposition: A | Discharge status: Home / Self Care | Payer: Commercial Managed Care - HMO | Source: Ambulatory Visit | Attending: Urology | Admitting: Urology

## 2022-04-15 DIAGNOSIS — C61 Malignant neoplasm of prostate: Secondary | ICD-10-CM | POA: Insufficient documentation

## 2022-04-17 ENCOUNTER — Other Ambulatory Visit (HOSPITAL_COMMUNITY): Payer: Self-pay

## 2022-04-17 ENCOUNTER — Encounter (HOSPITAL_COMMUNITY): Payer: Self-pay

## 2022-04-18 ENCOUNTER — Ambulatory Visit (HOSPITAL_COMMUNITY)
Admission: RE | Admit: 2022-04-18 | Discharge: 2022-04-18 | Disposition: A | Discharge status: Home / Self Care | Payer: Commercial Managed Care - HMO | Source: Ambulatory Visit | Attending: Urology | Admitting: Urology

## 2022-04-18 DIAGNOSIS — C61 Malignant neoplasm of prostate: Secondary | ICD-10-CM | POA: Diagnosis present

## 2022-04-18 MED ORDER — PIFLIFOLASTAT F 18 (PYLARIFY) INJECTION
9.0000 | Freq: Once | INTRAVENOUS | Status: AC
  Administered 2022-04-18: 9 via INTRAVENOUS

## 2022-05-19 ENCOUNTER — Ambulatory Visit: Payer: Commercial Managed Care - HMO | Admitting: Family Medicine

## 2022-06-10 ENCOUNTER — Ambulatory Visit: Payer: Commercial Managed Care - HMO | Admitting: Family Medicine

## 2022-06-10 DIAGNOSIS — Z191 Hormone sensitive malignancy status: Secondary | ICD-10-CM | POA: Diagnosis not present

## 2022-06-10 DIAGNOSIS — C61 Malignant neoplasm of prostate: Secondary | ICD-10-CM | POA: Diagnosis not present

## 2022-06-24 ENCOUNTER — Encounter: Payer: Self-pay | Admitting: Physician Assistant

## 2022-06-24 ENCOUNTER — Ambulatory Visit: Payer: BC Managed Care – PPO | Admitting: Physician Assistant

## 2022-06-24 VITALS — BP 134/81 | HR 70 | Resp 20 | Ht 68.0 in | Wt 151.0 lb

## 2022-06-24 DIAGNOSIS — R413 Other amnesia: Secondary | ICD-10-CM

## 2022-06-24 NOTE — Patient Instructions (Signed)
It was a pleasure to see you today at our office.   Recommendations:  Follow up in 6 meses Continue Memantine 10 mg twice daily.  Siga in contacto con su medico primario, especialmente hable con el medico acerca de empezar un antidepresivo   Pension scheme manager actividades para hispanos, para salir de la casa y Paediatric nurse,     Whom to call:  Memory  decline, memory medications: Call out office [phone]   For psychiatric meds, mood meds: Please have your primary care physician manage these medications.       For assessment of decision of mental capacity and competency:  Call Dr. Anthoney Harada, geriatric psychiatrist at [phone]  For guidance in geriatric dementia issues please call [facility] [phone]  For guidance regarding [facility] and if placement were needed at the facility, contact Arnell Asal, Social Worker tel: [phone]  If you have any severe symptoms of a stroke, or other severe issues such as confusion,severe chills or fever, etc call 911 or go to the ER as you may need to be evaluate further   Feel free to visit Facebook page " [email or web address]" for tips of how to care for people with memory problems.      RECOMMENDATIONS FOR ALL PATIENTS WITH MEMORY PROBLEMS: 1. Continue to exercise (Recommend 30 minutes of walking everyday, or 3 hours every week) 2. Increase social interactions - continue going to [place] and enjoy social gatherings with friends and family 3. Eat healthy, avoid fried foods and eat more fruits and vegetables 4. Maintain adequate blood pressure, blood sugar, and blood cholesterol level. Reducing the risk of stroke and cardiovascular disease also helps promoting better memory. 5. Avoid stressful situations. Live a simple life and avoid aggravations. Organize your time and prepare for the next day in anticipation. 6. Sleep well, avoid any interruptions of sleep and avoid any distractions in the bedroom that may interfere  with adequate sleep quality 7. Avoid sugar, avoid sweets as there is a strong link between excessive sugar intake, diabetes, and cognitive impairment We discussed the Mediterranean diet, which has been shown to help patients reduce the risk of progressive memory disorders and reduces cardiovascular risk. This includes eating fish, eat fruits and green leafy vegetables, nuts like almonds and hazelnuts, walnuts, and also use olive oil. Avoid fast foods and fried foods as much as possible. Avoid sweets and sugar as sugar use has been linked to worsening of memory function.  There is always a concern of gradual progression of memory problems. If this is the case, then we may need to adjust level of care according to patient needs. Support, both to the patient and caregiver, should then be put into place.    The Alzheimer's Association is here all day, every day for people facing Alzheimer's disease through our free 24/7 Helpline: [phone]. The Helpline provides reliable information and support to all those who need assistance, such as individuals living with memory loss, Alzheimer's or other dementia, caregivers, health care professionals and the public.  Our highly trained and knowledgeable staff can help you with: Understanding memory loss, dementia and Alzheimer's  Medications and other treatment options  General information about aging and brain health  Skills to provide quality care and to find the best care from professionals  Legal, financial and living-arrangement decisions Our Helpline also features: Confidential care consultation provided by master's level clinicians who can help with decision-making support, crisis assistance and education on issues families face every day  Help in  a caller's preferred language using our translation service that features more than 200 languages and dialects  Referrals to local community programs, services and ongoing support     FALL PRECAUTIONS: Be  cautious when walking. Scan the area for obstacles that may increase the risk of trips and falls. When getting up in the mornings, sit up at the edge of the bed for a few minutes before getting out of bed. Consider elevating the bed at the head end to avoid drop of blood pressure when getting up. Walk always in a well-lit room (use night lights in the walls). Avoid area rugs or power cords from appliances in the middle of the walkways. Use a walker or a cane if necessary and consider physical therapy for balance exercise. Get your eyesight checked regularly.  FINANCIAL OVERSIGHT: Supervision, especially oversight when making financial decisions or transactions is also recommended.  HOME SAFETY: Consider the safety of the kitchen when operating appliances like stoves, microwave oven, and blender. Consider having supervision and share cooking responsibilities until no longer able to participate in those. Accidents with firearms and other hazards in the house should be identified and addressed as well.   ABILITY TO BE LEFT ALONE: If patient is unable to contact 911 operator, consider using LifeLine, or when the need is there, arrange for someone to stay with patients. Smoking is a fire hazard, consider supervision or cessation. Risk of wandering should be assessed by caregiver and if detected at any point, supervision and safe proof recommendations should be instituted.  MEDICATION SUPERVISION: Inability to self-administer medication needs to be constantly addressed. Implement a mechanism to ensure safe administration of the medications.    Mediterranean Diet A Mediterranean diet refers to food and lifestyle choices that are based on the traditions of countries located on the [employer]. This way of eating has been shown to help prevent certain conditions and improve outcomes for people who have chronic diseases, like kidney disease and heart disease. What are tips for following this  plan? Lifestyle  Cook and eat meals together with your family, when possible. Drink enough fluid to keep your urine clear or pale yellow. Be physically active every day. This includes: Aerobic exercise like running or swimming. Leisure activities like gardening, walking, or housework. Get 7-8 hours of sleep each night. If recommended by your health care provider, drink red wine in moderation. This means 1 glass a day for nonpregnant women and 2 glasses a day for men. A glass of wine equals 5 oz (150 mL). Reading food labels  Check the serving size of packaged foods. For foods such as rice and pasta, the serving size refers to the amount of cooked product, not dry. Check the total fat in packaged foods. Avoid foods that have saturated fat or trans fats. Check the ingredients list for added sugars, such as corn syrup. Shopping  At the grocery store, buy most of your food from the areas near the walls of the store. This includes: Fresh fruits and vegetables (produce). Grains, beans, nuts, and seeds. Some of these may be available in unpackaged forms or large amounts (in bulk). Fresh seafood. Poultry and eggs. Low-fat dairy products. Buy whole ingredients instead of prepackaged foods. Buy fresh fruits and vegetables in-season from local farmers markets. Buy frozen fruits and vegetables in resealable bags. If you do not have access to quality fresh seafood, buy precooked frozen shrimp or canned fish, such as tuna, salmon, or sardines. Buy small amounts of raw or cooked  vegetables, salads, or olives from the deli or salad bar at your store. Stock your pantry so you always have certain foods on hand, such as olive oil, canned tuna, canned tomatoes, rice, pasta, and beans. Cooking  Cook foods with extra-virgin olive oil instead of using butter or other vegetable oils. Have meat as a side dish, and have vegetables or grains as your main dish. This means having meat in small portions or adding  small amounts of meat to foods like pasta or stew. Use beans or vegetables instead of meat in common dishes like chili or lasagna. Experiment with different cooking methods. Try roasting or broiling vegetables instead of steaming or sauteing them. Add frozen vegetables to soups, stews, pasta, or rice. Add nuts or seeds for added healthy fat at each meal. You can add these to yogurt, salads, or vegetable dishes. Marinate fish or vegetables using olive oil, lemon juice, garlic, and fresh herbs. Meal planning  Plan to eat 1 vegetarian meal one day each week. Try to work up to 2 vegetarian meals, if possible. Eat seafood 2 or more times a week. Have healthy snacks readily available, such as: Vegetable sticks with hummus. Greek yogurt. Fruit and nut trail mix. Eat balanced meals throughout the week. This includes: Fruit: 2-3 servings a day Vegetables: 4-5 servings a day Low-fat dairy: 2 servings a day Fish, poultry, or lean meat: 1 serving a day Beans and legumes: 2 or more servings a week Nuts and seeds: 1-2 servings a day Whole grains: 6-8 servings a day Extra-virgin olive oil: 3-4 servings a day Limit red meat and sweets to only a few servings a month What are my food choices? Mediterranean diet Recommended Grains: Whole-grain pasta. Brown rice. Bulgar wheat. Polenta. Couscous. Whole-wheat bread. [place]. Vegetables: Artichokes. Beets. Broccoli. Cabbage. Carrots. Eggplant. Green beans. Chard. Kale. Spinach. Onions. Leeks. Peas. Squash. Tomatoes. Peppers. Radishes. Fruits: Apples. Apricots. Avocado. Berries. Bananas. Cherries. Dates. Figs. Grapes. Lemons. Melon. Oranges. Peaches. Plums. Pomegranate. Meats and other protein foods: Beans. Almonds. Sunflower seeds. Pine nuts. Peanuts. [place]. Salmon. Scallops. Shrimp. [place]. Tilapia. Clams. Oysters. Eggs. Dairy: Low-fat milk. Cheese. Greek yogurt. Beverages: Water. Red wine. Herbal tea. Fats and oils: Extra virgin olive oil. Avocado  oil. Grape seed oil. Sweets and desserts: [place] yogurt with honey. Baked apples. Poached pears. Trail mix. Seasoning and other foods: Basil. Cilantro. Coriander. Cumin. Mint. Parsley. Sage. Rosemary. Tarragon. Garlic. Oregano. Thyme. Pepper. Balsalmic vinegar. Tahini. Hummus. Tomato sauce. Olives. Mushrooms. Limit these Grains: Prepackaged pasta or rice dishes. Prepackaged cereal with added sugar. Vegetables: Deep fried potatoes (french fries). Fruits: Fruit canned in syrup. Meats and other protein foods: Beef. Pork. Lamb. Poultry with skin. Hot dogs. Berniece Salines. Dairy: Ice cream. Sour cream. Whole milk. Beverages: Juice. Sugar-sweetened soft drinks. Beer. Liquor and spirits. Fats and oils: Butter. Canola oil. Vegetable oil. Beef fat (tallow). Lard. Sweets and desserts: Cookies. Cakes. Pies. Candy. Seasoning and other foods: Mayonnaise. Premade sauces and marinades. The items listed may not be a complete list. Talk with your dietitian about what dietary choices are right for you. Summary The Mediterranean diet includes both food and lifestyle choices. Eat a variety of fresh fruits and vegetables, beans, nuts, seeds, and whole grains. Limit the amount of red meat and sweets that you eat. Talk with your health care provider about whether it is safe for you to drink red wine in moderation. This means 1 glass a day for nonpregnant women and 2 glasses a day for men. A glass of wine  equals 5 oz (150 mL). This information is not intended to replace advice given to you by your health care provider. Make sure you discuss any questions you have with your health care provider. Document Released: 03/06/2016 Document Revised: 04/08/2016 Document Reviewed: 03/06/2016 Elsevier Interactive Patient Education  2017 [employer].

## 2022-06-24 NOTE — Progress Notes (Signed)
Assessment/Plan:   Dementia likely due to Alzheimer's Disease   Kenneth Nixon is a very pleasant 76 y.o. RH Spanish-speaking male with a history of hypertension, prostate cancer, PAF, hyperlipidemia history of memory impairment likely due to Alzheimer's disease seen today in follow up for memory loss. Patient is currently on memantine 10 mg twice daily, tolerating well.  Personally reviewed MRI of the brain from 12/16/2021 is remarkable for mild generalized cerebral atrophy without lobar predilection, chronic microvascular changes. Overall, his memory is showing some decline compared to prior visits, however, his wife would prefer to concentrate on his mood issues followed by his PCP.  Recommendations Follow up in 6  months. Continue memantine 10 mg twice daily, side effects discussed  Recommend good control of cardiovascular risk factors Mood control as per PCP    Subjective:    This patient is accompanied in the office by his wife who supplements the history.  Previous records as well as any outside records available were reviewed prior to todays visit.  He was last seen on 12/16/2021, MoCA on 08/29/2021 was 14/30   Any changes in memory since last visit?  He no longer does jigsaw puzzles because it is more difficult for him. He also likes to read extensively "the same book over and over because he forgets that he read it before".  He forgets recent conversations and names of people . He is more isolated than before and plays music instead to past the time especially when he is in a crowd. He has more difficulty with comprehension than before repeats oneself?  Endorsed "very frequently " Disoriented when walking into a room?  Patient denies   Leaving objects in unusual places?  Not often    Ambulates  with difficulty?   Patient denies   Recent falls?  Patient denies   Any head injuries?  Patient denies   History of seizures?   Patient denies   Wandering behavior?  His  wife is concerned that he could wander off, therefore when he goes to the mailbox is only with monitoring by his wife Patient drives?   He does not drive since arriving to the [place].   Any mood changes since last visit? Sometimes he gets irritable  Any worsening depression?: Endorsed, he cries more than before, this is being addressed by his PCP Hallucinations?  Patient denies   Paranoia?  Patient denies   Patient reports that sleeps well with vivid dreams, REM behavior or sleepwalking   History of sleep apnea?  Patient denies   Any hygiene concerns? Sometimes is more challenging, she has to remind him to bathe because he says he already did it. She places the clothing on the bed, and he asks her "does this go up or down, does not coordinate " Independent of bathing and dressing?  Endorsed  Does the patient needs help with medications?  Wife prepares the pillbox Who is in charge of the finances?  They do the finances together Any changes in appetite?  "He eats a lot, he forgets that he ate and eats again " Patient have trouble swallowing? Sometimes he chokes with pills, only goes down with yogurt or applesauce  Does the patient cook?  Patient denies   Any kitchen accidents such as leaving the stove on? Patient denies   Any headaches?  Patient denies   Double vision? Patient denies   Any focal numbness or tingling?  Patient denies   Chronic back pain Patient denies   Unilateral  weakness?  Patient denies   Any tremors?  Patient denies   Any history of anosmia?  Patient denies   Any incontinence of urine? He has prolonged urination due to prostate issues Any bowel dysfunction?   Patient denies      Patient lives with: wife     Initial Visit 08/29/21 The patient is seen in neurologic consultation at the request of Stana Bunting, PA-C for the evaluation of memory.  The patient is accompanied by his wife who supplements the history. This is a 76 y.o. year old RH  male who recently moved in  December 2022 from [place] to live with his son.he carries a diagnosis of dementia for at least 2 years.  Initially he reports that his memory problems become around 40 years ago, when he was having difficulties remembering conversations or recent events.  His memory issues became worse 2 years ago, for which his neurologist started him on memantine 10 mg daily, which he tolerated well.  Since arriving here, his son noticed worsening of the patient's memory.  His wife states that he repeats himself frequently, and has shown decrease organization skills.  "He used to be very organized, but over the last 2 years this is less ".  He denies being disoriented when walking into the room, or leaving objects in unusual places.  He ambulates without difficulty, denies any recent falls or head injuries.  He no longer drives.  His mood t is good, denies depression, but at times becomes very anxious, missing [place].  He likes to read extensively especially before going to sleep.  He sleeps well, his wife states that sometimes he talks in his sleep, but there is no other REM behavior, or sleepwalking.  No hallucinations.  She states that although no frank paranoia, he has a fear about being alone.  There are no hygiene concerns, he enjoys bathing twice a day.  However, he needs to be told what goals first when he has to get dressed.  He is wife prepares the medications in a pillbox.  They do the finances together.  His appetite is good, denies trouble swallowing.  He used to cook, but he no longer does so.  He does not use the stove, but he does leave the fridge open on the faucet on especially over the last 6 months.  Denies headaches, double vision, dizziness, focal numbness or tingling, unilateral weakness, tremors or anosmia. No history of seizures.  He reports some urge incontinence, denies constipation or diarrhea. Denies OSA, ETOH or Tobacco. Family History remarkable for paternal and with Alzheimer's disease at  25.  PREVIOUS MEDICATIONS:   CURRENT MEDICATIONS:  Outpatient Encounter Medications as of 06/24/2022  Medication Sig   amiodarone (PACERONE) 200 MG tablet Take 200 mg by mouth daily.   amLODipine (NORVASC) 5 MG tablet Take 5 mg by mouth daily.   apixaban (ELIQUIS) 5 MG TABS tablet Take 5 mg by mouth 2 (two) times daily.   Ascorbic Acid (VITAMIN C) 100 MG tablet Take 100 mg by mouth daily.   Cholecalciferol (VITAMIN D3 PO) Take by mouth.   Glucosamine HCl (GLUCOSAMINE PO) Take by mouth.   memantine (NAMENDA) 10 MG tablet Take 1 tablet (10 mg total) by mouth 2 (two) times daily.   No facility-administered encounter medications on file as of 06/24/2022.        No data to display            09/16/2021   10:00 AM  Montreal Cognitive  Assessment   Visuospatial/ Executive (0/5) 0  Naming (0/3) 3  Attention: Read list of digits (0/2) 1  Attention: Read list of letters (0/1) 1  Attention: Serial 7 subtraction starting at 100 (0/3) 3  Language: Repeat phrase (0/2) 1  Language : Fluency (0/1) 0  Abstraction (0/2) 1  Delayed Recall (0/5) 2  Orientation (0/6) 2  Total 14  Adjusted Score (based on education) 14    Objective:     PHYSICAL EXAMINATION:    VITALS:   Vitals:   06/24/22 0856  BP: 134/81  Pulse: 70  Resp: 20  SpO2: 95%  Weight: 151 lb (68.5 kg)  Height: '5\' 8"'$  (1.727 m)    GEN:  The patient appears stated age and is in NAD. HEENT:  Normocephalic, atraumatic.   Neurological examination:  General: NAD, well-groomed, appears stated age. Orientation: The patient is alert. Oriented to person, place and date Cranial nerves: There is good facial symmetry.The speech is fluent and clear. No aphasia or dysarthria. Fund of knowledge is reduced.  Recent and remote memory are impaired. Attention and concentration are reduced.  Able to name objects and repeat phrases.  Hearing is intact to conversational tone.    Sensation: Sensation is intact to light touch  throughout Motor: Strength is at least antigravity x4. Tremors: none  DTR's 2/4 in UE/LE     Movement examination: Tone: There is normal tone in the UE/LE Abnormal movements:  no tremor.  No myoclonus.  No asterixis.   Coordination:  There is no decremation with RAM's. Normal finger to nose  Gait and Station: The patient has no difficulty arising out of a deep-seated chair without the use of the hands. The patient's stride length is good.  Gait is cautious and narrow.    Thank you for allowing [place] the opportunity to participate in the care of this nice patient. Please do not hesitate to contact us for any questions or concerns.   Total time spent on today's visit was 35 minutes dedicated to this patient today, preparing to see patient, examining the patient, ordering tests and/or medications and counseling the patient, documenting clinical information in the EHR or other health record, independently interpreting results and communicating results to the patient/family, discussing treatment and goals, answering patient's questions and coordinating care.  Cc:  Stana Bunting, PA-C  Sharene Butters 06/24/2022 9:26 AM

## 2022-07-12 DIAGNOSIS — C61 Malignant neoplasm of prostate: Secondary | ICD-10-CM | POA: Diagnosis not present

## 2022-07-12 DIAGNOSIS — Z191 Hormone sensitive malignancy status: Secondary | ICD-10-CM | POA: Diagnosis not present

## 2022-07-15 DIAGNOSIS — Z510 Encounter for antineoplastic radiation therapy: Secondary | ICD-10-CM | POA: Diagnosis not present

## 2022-07-15 DIAGNOSIS — C61 Malignant neoplasm of prostate: Secondary | ICD-10-CM | POA: Diagnosis not present

## 2022-07-15 DIAGNOSIS — Z01818 Encounter for other preprocedural examination: Secondary | ICD-10-CM | POA: Diagnosis not present

## 2022-07-15 DIAGNOSIS — Z191 Hormone sensitive malignancy status: Secondary | ICD-10-CM | POA: Diagnosis not present

## 2022-07-16 DIAGNOSIS — Z191 Hormone sensitive malignancy status: Secondary | ICD-10-CM | POA: Diagnosis not present

## 2022-07-16 DIAGNOSIS — C61 Malignant neoplasm of prostate: Secondary | ICD-10-CM | POA: Diagnosis not present

## 2022-07-23 DIAGNOSIS — C61 Malignant neoplasm of prostate: Secondary | ICD-10-CM | POA: Diagnosis not present

## 2022-07-23 DIAGNOSIS — Z191 Hormone sensitive malignancy status: Secondary | ICD-10-CM | POA: Diagnosis not present

## 2022-07-29 DIAGNOSIS — C61 Malignant neoplasm of prostate: Secondary | ICD-10-CM | POA: Diagnosis not present

## 2022-07-29 DIAGNOSIS — Z191 Hormone sensitive malignancy status: Secondary | ICD-10-CM | POA: Diagnosis not present

## 2022-07-29 DIAGNOSIS — Z510 Encounter for antineoplastic radiation therapy: Secondary | ICD-10-CM | POA: Diagnosis not present

## 2022-07-31 DIAGNOSIS — Z191 Hormone sensitive malignancy status: Secondary | ICD-10-CM | POA: Diagnosis not present

## 2022-07-31 DIAGNOSIS — Z510 Encounter for antineoplastic radiation therapy: Secondary | ICD-10-CM | POA: Diagnosis not present

## 2022-07-31 DIAGNOSIS — C61 Malignant neoplasm of prostate: Secondary | ICD-10-CM | POA: Diagnosis not present

## 2022-08-01 DIAGNOSIS — Z510 Encounter for antineoplastic radiation therapy: Secondary | ICD-10-CM | POA: Diagnosis not present

## 2022-08-01 DIAGNOSIS — C61 Malignant neoplasm of prostate: Secondary | ICD-10-CM | POA: Diagnosis not present

## 2022-08-01 DIAGNOSIS — Z191 Hormone sensitive malignancy status: Secondary | ICD-10-CM | POA: Diagnosis not present

## 2022-08-04 DIAGNOSIS — Z191 Hormone sensitive malignancy status: Secondary | ICD-10-CM | POA: Diagnosis not present

## 2022-08-04 DIAGNOSIS — C61 Malignant neoplasm of prostate: Secondary | ICD-10-CM | POA: Diagnosis not present

## 2022-08-04 DIAGNOSIS — Z510 Encounter for antineoplastic radiation therapy: Secondary | ICD-10-CM | POA: Diagnosis not present

## 2022-08-05 DIAGNOSIS — Z510 Encounter for antineoplastic radiation therapy: Secondary | ICD-10-CM | POA: Diagnosis not present

## 2022-08-05 DIAGNOSIS — Z191 Hormone sensitive malignancy status: Secondary | ICD-10-CM | POA: Diagnosis not present

## 2022-08-05 DIAGNOSIS — C61 Malignant neoplasm of prostate: Secondary | ICD-10-CM | POA: Diagnosis not present

## 2022-08-06 DIAGNOSIS — C61 Malignant neoplasm of prostate: Secondary | ICD-10-CM | POA: Diagnosis not present

## 2022-08-06 DIAGNOSIS — Z510 Encounter for antineoplastic radiation therapy: Secondary | ICD-10-CM | POA: Diagnosis not present

## 2022-08-06 DIAGNOSIS — Z191 Hormone sensitive malignancy status: Secondary | ICD-10-CM | POA: Diagnosis not present

## 2022-08-07 DIAGNOSIS — Z191 Hormone sensitive malignancy status: Secondary | ICD-10-CM | POA: Diagnosis not present

## 2022-08-07 DIAGNOSIS — C61 Malignant neoplasm of prostate: Secondary | ICD-10-CM | POA: Diagnosis not present

## 2022-08-07 DIAGNOSIS — Z510 Encounter for antineoplastic radiation therapy: Secondary | ICD-10-CM | POA: Diagnosis not present

## 2022-08-08 DIAGNOSIS — Z191 Hormone sensitive malignancy status: Secondary | ICD-10-CM | POA: Diagnosis not present

## 2022-08-08 DIAGNOSIS — Z510 Encounter for antineoplastic radiation therapy: Secondary | ICD-10-CM | POA: Diagnosis not present

## 2022-08-08 DIAGNOSIS — C61 Malignant neoplasm of prostate: Secondary | ICD-10-CM | POA: Diagnosis not present

## 2022-08-11 DIAGNOSIS — Z191 Hormone sensitive malignancy status: Secondary | ICD-10-CM | POA: Diagnosis not present

## 2022-08-11 DIAGNOSIS — C61 Malignant neoplasm of prostate: Secondary | ICD-10-CM | POA: Diagnosis not present

## 2022-08-11 DIAGNOSIS — Z510 Encounter for antineoplastic radiation therapy: Secondary | ICD-10-CM | POA: Diagnosis not present

## 2022-08-12 DIAGNOSIS — C61 Malignant neoplasm of prostate: Secondary | ICD-10-CM | POA: Diagnosis not present

## 2022-08-12 DIAGNOSIS — Z510 Encounter for antineoplastic radiation therapy: Secondary | ICD-10-CM | POA: Diagnosis not present

## 2022-08-12 DIAGNOSIS — Z191 Hormone sensitive malignancy status: Secondary | ICD-10-CM | POA: Diagnosis not present

## 2022-08-13 DIAGNOSIS — Z510 Encounter for antineoplastic radiation therapy: Secondary | ICD-10-CM | POA: Diagnosis not present

## 2022-08-13 DIAGNOSIS — Z191 Hormone sensitive malignancy status: Secondary | ICD-10-CM | POA: Diagnosis not present

## 2022-08-13 DIAGNOSIS — C61 Malignant neoplasm of prostate: Secondary | ICD-10-CM | POA: Diagnosis not present

## 2022-08-14 DIAGNOSIS — Z510 Encounter for antineoplastic radiation therapy: Secondary | ICD-10-CM | POA: Diagnosis not present

## 2022-08-14 DIAGNOSIS — C61 Malignant neoplasm of prostate: Secondary | ICD-10-CM | POA: Diagnosis not present

## 2022-08-14 DIAGNOSIS — Z191 Hormone sensitive malignancy status: Secondary | ICD-10-CM | POA: Diagnosis not present

## 2022-08-18 DIAGNOSIS — Z191 Hormone sensitive malignancy status: Secondary | ICD-10-CM | POA: Diagnosis not present

## 2022-08-18 DIAGNOSIS — Z510 Encounter for antineoplastic radiation therapy: Secondary | ICD-10-CM | POA: Diagnosis not present

## 2022-08-18 DIAGNOSIS — C61 Malignant neoplasm of prostate: Secondary | ICD-10-CM | POA: Diagnosis not present

## 2022-08-19 DIAGNOSIS — Z191 Hormone sensitive malignancy status: Secondary | ICD-10-CM | POA: Diagnosis not present

## 2022-08-19 DIAGNOSIS — C61 Malignant neoplasm of prostate: Secondary | ICD-10-CM | POA: Diagnosis not present

## 2022-08-19 DIAGNOSIS — Z510 Encounter for antineoplastic radiation therapy: Secondary | ICD-10-CM | POA: Diagnosis not present

## 2022-08-20 DIAGNOSIS — Z510 Encounter for antineoplastic radiation therapy: Secondary | ICD-10-CM | POA: Diagnosis not present

## 2022-08-20 DIAGNOSIS — Z191 Hormone sensitive malignancy status: Secondary | ICD-10-CM | POA: Diagnosis not present

## 2022-08-20 DIAGNOSIS — C61 Malignant neoplasm of prostate: Secondary | ICD-10-CM | POA: Diagnosis not present

## 2022-08-21 DIAGNOSIS — Z510 Encounter for antineoplastic radiation therapy: Secondary | ICD-10-CM | POA: Diagnosis not present

## 2022-08-21 DIAGNOSIS — Z191 Hormone sensitive malignancy status: Secondary | ICD-10-CM | POA: Diagnosis not present

## 2022-08-21 DIAGNOSIS — C61 Malignant neoplasm of prostate: Secondary | ICD-10-CM | POA: Diagnosis not present

## 2022-08-22 DIAGNOSIS — C61 Malignant neoplasm of prostate: Secondary | ICD-10-CM | POA: Diagnosis not present

## 2022-08-22 DIAGNOSIS — Z510 Encounter for antineoplastic radiation therapy: Secondary | ICD-10-CM | POA: Diagnosis not present

## 2022-08-22 DIAGNOSIS — Z191 Hormone sensitive malignancy status: Secondary | ICD-10-CM | POA: Diagnosis not present

## 2022-08-25 DIAGNOSIS — C61 Malignant neoplasm of prostate: Secondary | ICD-10-CM | POA: Diagnosis not present

## 2022-08-25 DIAGNOSIS — Z191 Hormone sensitive malignancy status: Secondary | ICD-10-CM | POA: Diagnosis not present

## 2022-08-25 DIAGNOSIS — Z510 Encounter for antineoplastic radiation therapy: Secondary | ICD-10-CM | POA: Diagnosis not present

## 2022-08-26 DIAGNOSIS — C61 Malignant neoplasm of prostate: Secondary | ICD-10-CM | POA: Diagnosis not present

## 2022-08-26 DIAGNOSIS — Z191 Hormone sensitive malignancy status: Secondary | ICD-10-CM | POA: Diagnosis not present

## 2022-08-26 DIAGNOSIS — Z510 Encounter for antineoplastic radiation therapy: Secondary | ICD-10-CM | POA: Diagnosis not present

## 2022-08-27 DIAGNOSIS — C61 Malignant neoplasm of prostate: Secondary | ICD-10-CM | POA: Diagnosis not present

## 2022-08-27 DIAGNOSIS — Z191 Hormone sensitive malignancy status: Secondary | ICD-10-CM | POA: Diagnosis not present

## 2022-08-27 DIAGNOSIS — Z510 Encounter for antineoplastic radiation therapy: Secondary | ICD-10-CM | POA: Diagnosis not present

## 2022-08-28 DIAGNOSIS — F0390 Unspecified dementia without behavioral disturbance: Secondary | ICD-10-CM | POA: Diagnosis not present

## 2022-08-28 DIAGNOSIS — Z510 Encounter for antineoplastic radiation therapy: Secondary | ICD-10-CM | POA: Diagnosis not present

## 2022-08-28 DIAGNOSIS — Z191 Hormone sensitive malignancy status: Secondary | ICD-10-CM | POA: Diagnosis not present

## 2022-08-28 DIAGNOSIS — C61 Malignant neoplasm of prostate: Secondary | ICD-10-CM | POA: Diagnosis not present

## 2022-09-10 ENCOUNTER — Ambulatory Visit: Payer: BC Managed Care – PPO | Attending: Internal Medicine | Admitting: Internal Medicine

## 2022-09-10 ENCOUNTER — Encounter: Payer: Self-pay | Admitting: Internal Medicine

## 2022-09-10 VITALS — BP 122/78 | HR 56 | Ht 68.0 in | Wt 150.0 lb

## 2022-09-10 DIAGNOSIS — I4891 Unspecified atrial fibrillation: Secondary | ICD-10-CM

## 2022-09-10 MED ORDER — AMLODIPINE BESYLATE 5 MG PO TABS: 5.0000 mg | ORAL_TABLET | Freq: Every day | ORAL | 6 refills | Status: DC

## 2022-09-10 NOTE — Progress Notes (Signed)
Cardiology Office Note:    Date:  09/10/2022   ID:  Nixon, Kenneth [birth year], MRN [id number]  PCP:  Stana Bunting, PA-C   [facility] Providers Cardiologist:  Janina Mayo, MD     Referring MD: Stana Bunting, PA-C   No chief complaint on file. Atrial Fibrillation  History of Present Illness:    Kenneth Nixon is a 77 y.o. male with a hx of HTN, HLD, referral for  atrial fibrillation  [name] recently moved from [place]. It's been 3 months.  She has hx of dementia. She was diagnosed with atrial fibrillation in [place]. She was taking amiodarone 200 mg every other day. He was noted to be in rate controlled afib there. He was started on eliquis 5 mg BID. He is taking the every 2 days. He was diagnosed 3 years ago.  He felt his heart racing. He has had no cardiac procedures. He denies SOB or palpitations. He has not had an echo. No known thyroid disease. No apnea. No orthopnea or PND. He denies dyspnea on exertion or chest pain with exertion.  He is active and he walks all the time. No LE edema. No stress test.   TSH is normal.  Interim Hx: Ziopatch showing 35% burden afib. He's continued on amiodarone and eliquis. He denies dyspnea. He has vertigo. He snores, no apnea. No orthopnea or PND.  Interim hx 09/10/2022 No significant palpitations. No issues with medications. His norvasc was decreased.  Current Medications: Current Outpatient Medications on File Prior to Visit  Medication Sig Dispense Refill   amiodarone (PACERONE) 200 MG tablet Take 200 mg by mouth daily.     amLODipine (NORVASC) 5 MG tablet Take 5 mg by mouth daily.     apixaban (ELIQUIS) 5 MG TABS tablet Take 5 mg by mouth 2 (two) times daily.     Ascorbic Acid (VITAMIN C) 100 MG tablet Take 100 mg by mouth daily.     Cholecalciferol (VITAMIN D3 PO) Take by mouth.     Glucosamine HCl (GLUCOSAMINE PO) Take by mouth.     memantine (NAMENDA) 10 MG tablet Take 1 tablet  (10 mg total) by mouth 2 (two) times daily. 60 tablet 11   No current facility-administered medications on file prior to visit.    Allergies:   Patient has no known allergies.   Social History   Socioeconomic History   Marital status: Married    Spouse name: Not on file   Number of children: 5   Years of education: 12   Highest education level: Not on file  Occupational History   Not on file  Tobacco Use   Smoking status: Never   Smokeless tobacco: Never  Substance and Sexual Activity   Alcohol use: Never   Drug use: Not on file   Sexual activity: Not on file  Other Topics Concern   Not on file  Social History Narrative   Right handed   Drinks caffeine prn   Two story home   Retired lives with his wife   Spanish speaking   Social Determinants of Health   Financial Resource Strain: Not on file  Food Insecurity: Not on file  Transportation Needs: Not on file  Physical Activity: Not on file  Stress: Not on file  Social Connections: Not on file     Family History: The patient's reviewed not pertinent  ROS:   Please see the history of present illness.     All other systems reviewed  and are negative.  EKGs/Labs/Other Studies Reviewed:    The following studies were reviewed today:   EKG:  EKG is  ordered today.  The ekg ordered today demonstrates   10/31/2021- EKG NSR, PACs  08/31/2021- sinus bradycardia HR 56 BPM   Recent Labs: No results found for requested labs within last 365 days.  Recent Lipid Panel No results found for: "CHOL", "TRIG", "HDL", "CHOLHDL", "VLDL", "LDLCALC", "LDLDIRECT"   Risk Assessment/Calculations:    CHA2DS2-VASc Score = 3   This indicates a 3.2% annual risk of stroke. The patient's score is based upon: CHF History: 0 HTN History: 1 Diabetes History: 0 Stroke History: 0 Vascular Disease History: 0 Age Score: 2 Gender Score: 0          Physical Exam:    VS:    Vitals:   09/10/22 1415  BP: 122/78  Pulse: (!) 56   SpO2: 96%      Wt Readings from Last 3 Encounters:  06/24/22 151 lb (68.5 kg)  02/05/22 155 lb 12.8 oz (70.7 kg)  12/16/21 156 lb (70.8 kg)     GEN:  Well nourished, well developed in no acute distress HEENT: Normal NECK: No JVD; No carotid bruits LYMPHATICS: No lymphadenopathy CARDIAC: RRR, no murmurs, rubs, gallops RESPIRATORY:  Clear to auscultation without rales, wheezing or rhonchi  ABDOMEN: Soft, non-tender, non-distended MUSCULOSKELETAL:  No edema; No deformity  SKIN: Warm and dry NEUROLOGIC:  Alert and oriented x 3 PSYCHIATRIC:  Normal affect   ASSESSMENT:    Paroxysmal Atrial Fibrillation: CHADS2VASC=3; diagnosed in [place].  He is asymptomatic. No sleep apnea signs. No lung disease. Normal liver function.  Normal renal function. Echo shows mildly dilated LA. EF closer to 55-60% with normal strain. He has mild to moderate AI with sclerotic valve. Zio showed 35% burden. -  Will continue amiodarone he is taking amiodarone 200 mg every other day  - continue eliquis 5 mg BID   HTN- Good control. continue amlodipine 2.5 mg daily of norvasc, can continue to break in half  HLD- considering his age of 79; no strict guidelines after 79 years of age according to the USPSTF . There is data to support reduction in cardiac events, however he is low risk. Can defer statin therapy  PLAN:    In order of problems listed above:  Continue 2.5 mg daily of norvasc Follow up 6 months      Medication Adjustments/Labs and Tests Ordered: Current medicines are reviewed at length with the patient today.  Concerns regarding medicines are outlined above.    Signed, Janina Mayo, MD  09/10/2022 1:55 PM    [facility]

## 2022-09-10 NOTE — Patient Instructions (Addendum)
Medication Instructions:   No changes   Amlodipine 5 mg  has been refilled.   *If you need a refill on your cardiac medications before your next appointment, please call your pharmacy*   Lab Work: No labs    Testing/Procedures:  Not needed  Follow-Up: At [facility], you and your health needs are our priority.  As part of our continuing mission to provide you with exceptional heart care, we have created designated Provider Care Teams.  These Care Teams include your primary Cardiologist (physician) and Advanced Practice Providers (APPs -  Physician Assistants and Nurse Practitioners) who all work together to provide you with the care you need, when you need it.     Your next appointment:   6 month(s)  The format for your next appointment:   In Person  Provider:   Janina Mayo, MD

## 2022-09-26 ENCOUNTER — Other Ambulatory Visit: Payer: Self-pay | Admitting: Physician Assistant

## 2022-10-16 ENCOUNTER — Other Ambulatory Visit: Payer: Self-pay

## 2022-10-16 ENCOUNTER — Telehealth: Payer: Self-pay | Admitting: Physician Assistant

## 2022-10-16 MED ORDER — MEMANTINE HCL 10 MG PO TABS: 10.0000 mg | ORAL_TABLET | Freq: Two times a day (BID) | ORAL | 2 refills | Status: DC

## 2022-10-16 NOTE — Telephone Encounter (Signed)
Left message the after hour service on 10-16-22 at 1:01 pm   Caller states they need a refill on the fathers medication   did not leave the name of the medication

## 2022-10-27 ENCOUNTER — Other Ambulatory Visit: Payer: Self-pay | Admitting: Physician Assistant

## 2022-10-31 ENCOUNTER — Telehealth: Payer: Self-pay

## 2022-10-31 DIAGNOSIS — F03911 Unspecified dementia, unspecified severity, with agitation: Secondary | ICD-10-CM | POA: Diagnosis not present

## 2022-10-31 DIAGNOSIS — I10 Essential (primary) hypertension: Secondary | ICD-10-CM | POA: Diagnosis not present

## 2022-10-31 DIAGNOSIS — G4700 Insomnia, unspecified: Secondary | ICD-10-CM | POA: Diagnosis not present

## 2022-10-31 MED ORDER — MIRTAZAPINE 7.5 MG PO TABS: 7.5000 mg | ORAL_TABLET | Freq: Every day | ORAL | 0 refills | Status: AC

## 2022-10-31 NOTE — Telephone Encounter (Signed)
We can start low dose of mirtazepine 7.5mg  to help with mood and sleep.  I will send Rx.

## 2022-10-31 NOTE — Telephone Encounter (Signed)
Telephone call from [place], She wanted update Kenneth Nixon. The wife and patient had a follow up with her an mentioned that the patient been having some mood swings, getting aggravated with his Wife more, having troubles sleeping for the last six months. They were asked why did they not call the office to seek help sooner. Wife didn't think she could call and ask for help. She feels like the Memantine not working as well.  PA Lily Peer is asking for help to treat the mood.

## 2022-11-03 NOTE — Telephone Encounter (Signed)
Daughter advised of Medication called in. 30 day supply sent, If you have any problems please give [place] a call or send [place] a mychart message.

## 2022-11-10 ENCOUNTER — Encounter: Payer: Self-pay | Admitting: *Deleted

## 2022-11-26 DIAGNOSIS — Z1330 Encounter for screening examination for mental health and behavioral disorders, unspecified: Secondary | ICD-10-CM | POA: Diagnosis not present

## 2022-11-26 DIAGNOSIS — C61 Malignant neoplasm of prostate: Secondary | ICD-10-CM | POA: Diagnosis not present

## 2022-12-01 DIAGNOSIS — Z23 Encounter for immunization: Secondary | ICD-10-CM | POA: Diagnosis not present

## 2022-12-01 DIAGNOSIS — Z0000 Encounter for general adult medical examination without abnormal findings: Secondary | ICD-10-CM | POA: Diagnosis not present

## 2022-12-01 DIAGNOSIS — M6530 Trigger finger, unspecified finger: Secondary | ICD-10-CM | POA: Diagnosis not present

## 2022-12-01 DIAGNOSIS — E785 Hyperlipidemia, unspecified: Secondary | ICD-10-CM | POA: Diagnosis not present

## 2022-12-01 DIAGNOSIS — E878 Other disorders of electrolyte and fluid balance, not elsewhere classified: Secondary | ICD-10-CM | POA: Diagnosis not present

## 2022-12-17 DIAGNOSIS — Z1211 Encounter for screening for malignant neoplasm of colon: Secondary | ICD-10-CM | POA: Diagnosis not present

## 2022-12-17 DIAGNOSIS — Z0000 Encounter for general adult medical examination without abnormal findings: Secondary | ICD-10-CM | POA: Diagnosis not present

## 2022-12-17 DIAGNOSIS — Z23 Encounter for immunization: Secondary | ICD-10-CM | POA: Diagnosis not present

## 2022-12-17 DIAGNOSIS — R143 Flatulence: Secondary | ICD-10-CM | POA: Diagnosis not present

## 2022-12-29 ENCOUNTER — Ambulatory Visit: Payer: Commercial Managed Care - HMO | Admitting: Physician Assistant

## 2023-01-02 ENCOUNTER — Encounter: Payer: Self-pay | Admitting: Physician Assistant

## 2023-01-02 ENCOUNTER — Ambulatory Visit (INDEPENDENT_AMBULATORY_CARE_PROVIDER_SITE_OTHER): Payer: BC Managed Care – PPO | Admitting: Physician Assistant

## 2023-01-02 VITALS — BP 106/72 | HR 61 | Resp 18 | Ht 68.0 in | Wt 152.0 lb

## 2023-01-02 DIAGNOSIS — F0150 Vascular dementia without behavioral disturbance: Secondary | ICD-10-CM | POA: Diagnosis not present

## 2023-01-02 DIAGNOSIS — F0280 Dementia in other diseases classified elsewhere without behavioral disturbance: Secondary | ICD-10-CM | POA: Diagnosis not present

## 2023-01-02 DIAGNOSIS — G309 Alzheimer's disease, unspecified: Secondary | ICD-10-CM | POA: Diagnosis not present

## 2023-01-02 NOTE — Progress Notes (Signed)
Assessment/Plan:   Dementia likely due to Alzheimer's disease with behavioral disturbance  Kenneth Nixon is a very pleasant 77 y.o. RH Spanish-speaking male with a history of hypertension, prostate cancer, PAF, hyperlipidemia, history of dementia likely due to Alzheimer's disease seen today in follow up for memory loss. Patient is currently on memantine 10 mg twice daily.  Memory shows slight decline, although unable to add another agent due to his history of bradycardia.  No new complaints.  He tries to remain active.  "He is happy "according to his wife.    Follow up in 6  months. Continue memantine 10 mg twice daily, side effects discussed Recommend good control of her cardiovascular risk factors Consider adult day programs Continue to control mood as per PCP    Subjective:    This patient is accompanied in the office by his daughter and his wife who supplements the history.  Previous records as well as any outside records available were reviewed prior to todays visit. Patient was last seen on 06/24/2022    Any changes in memory since last visit?  "Memory may be worse ".  He likes to read extensively "the same book over an hour because he forgets that he ran before ".  He forgets recent conversations and names of people.  He may call his daughter, seen.  He likes to play music to past the time, does not socialize as before.  More difficulty with comprehension than prior. repeats oneself?  Endorsed "very frequently " disoriented when walking into a room?  Patient denies except occasionally not remembering what patient came to the room for    Leaving objects in unusual places?    denies   Wandering behavior?  She is concerned that he could wander off so he only goes to the mailbox with his wife monitoring him. Has to always to be his wife  Any personality changes since last visit?  Sometimes he gets irritable. Any worsening depression?:  He was started on with some  improvement of this behavior. Hallucinations or paranoia? He accuses his wife of taking the perfume all the time.  Seizures?    denies    Any sleep changes?  Denies vivid dreams, REM behavior or sleepwalking   Sleep apnea?   denies   Any hygiene concerns?  Wife has to remind him to bathe.  He thinks that he already did.  She places the clothing in the bed and he needs assistance because he does not know how to put it on. Independent of bathing and dressing?  Endorsed  Does the patient needs help with medications?  Wife is in charge, because there were pills all over  Who is in charge of the finances?  Wife is in charge     Any changes in appetite?  He forgets that he did not eat and he has to be reminded to do so  patient have trouble swallowing?  As before, with some pills, he has trouble so he uses yogurt or applesauce to let them go down. Does the patient cook?  Any kitchen accidents such as leaving the stove on? Patient denies   Any headaches?   denies   Chronic back pain  denies   Ambulates with difficulty?     denies    Recent falls or head injuries? denies     Unilateral weakness, numbness or tingling?  denies   Any tremors?  denies   Any anosmia?  Patient denies   Any incontinence of  urine?  He has prolonged urination due to prostate issues Any bowel dysfunction?     denies      Patient lives with his wife Does the patient drive?  He does not driven since arriving to the [place].      Initial Visit 08/29/21 The patient is seen in neurologic consultation at the request of Mechele Dawley, PA-C for the evaluation of memory.  The patient is accompanied by his wife who supplements the history. This is a 77 y.o. year old RH  male who recently moved in December 2022 from [place] to live with his son.he carries a diagnosis of dementia for at least 2 years.  Initially he reports that his memory problems become around 40 years ago, when he was having difficulties remembering conversations or  recent events.  His memory issues became worse 2 years ago, for which his neurologist started him on memantine 10 mg daily, which he tolerated well.  Since arriving here, his son noticed worsening of the patient's memory.  His wife states that he repeats himself frequently, and has shown decrease organization skills.  "He used to be very organized, but over the last 2 years this is less ".  He denies being disoriented when walking into the room, or leaving objects in unusual places.  He ambulates without difficulty, denies any recent falls or head injuries.  He no longer drives.  His mood t is good, denies depression, but at times becomes very anxious, missing [place].  He likes to read extensively especially before going to sleep.  He sleeps well, his wife states that sometimes he talks in his sleep, but there is no other REM behavior, or sleepwalking.  No hallucinations.  She states that although no frank paranoia, he has a fear about being alone.  There are no hygiene concerns, he enjoys bathing twice a day.  However, he needs to be told what goals first when he has to get dressed.  He is wife prepares the medications in a pillbox.  They do the finances together.  His appetite is good, denies trouble swallowing.  He used to cook, but he no longer does so.  He does not use the stove, but he does leave the fridge open on the faucet on especially over the last 6 months.  Denies headaches, double vision, dizziness, focal numbness or tingling, unilateral weakness, tremors or anosmia. No history of seizures.  He reports some urge incontinence, denies constipation or diarrhea. Denies OSA, ETOH or Tobacco. Family History remarkable for paternal and with Alzheimer's disease at 88.  PREVIOUS MEDICATIONS:   CURRENT MEDICATIONS:  Outpatient Encounter Medications as of 01/02/2023  Medication Sig   amiodarone (PACERONE) 200 MG tablet Take 200 mg by mouth daily.   amLODipine (NORVASC) 5 MG tablet Take 1 tablet (5 mg  total) by mouth daily.   apixaban (ELIQUIS) 5 MG TABS tablet Take 5 mg by mouth 2 (two) times daily.   Ascorbic Acid (VITAMIN C) 100 MG tablet Take 100 mg by mouth daily.   Cholecalciferol (VITAMIN D3 PO) Take by mouth.   Glucosamine HCl (GLUCOSAMINE PO) Take by mouth.   memantine (NAMENDA) 10 MG tablet Take 1 tablet (10 mg total) by mouth 2 (two) times daily.   mirtazapine (REMERON) 7.5 MG tablet Take 1 tablet (7.5 mg total) by mouth at bedtime.   No facility-administered encounter medications on file as of 01/02/2023.       06/24/2022    9:00 AM  MMSE - Mini Mental  State Exam  Orientation to time 0  Orientation to Place 1  Registration 3  Attention/ Calculation 3  Recall 0  Language- name 2 objects 2  Language- repeat 1  Language- follow 3 step command 3  Language- read & follow direction 1  Write a sentence 1  Copy design 0  Total score 15      09/16/2021   10:00 AM  Montreal Cognitive Assessment   Visuospatial/ Executive (0/5) 0  Naming (0/3) 3  Attention: Read list of digits (0/2) 1  Attention: Read list of letters (0/1) 1  Attention: Serial 7 subtraction starting at 100 (0/3) 3  Language: Repeat phrase (0/2) 1  Language : Fluency (0/1) 0  Abstraction (0/2) 1  Delayed Recall (0/5) 2  Orientation (0/6) 2  Total 14  Adjusted Score (based on education) 14    Objective:     PHYSICAL EXAMINATION:    VITALS:   Vitals:   01/02/23 1044  BP: 106/72  Pulse: 61  Resp: 18  SpO2: 98%  Weight: 152 lb (68.9 kg)  Height: 5\' 8"  (1.727 m)    GEN:  The patient appears stated age and is in NAD. HEENT:  Normocephalic, atraumatic.   Neurological examination:  General: NAD, well-groomed, appears stated age. Orientation: The patient is alert. Oriented to person, not to place or date.   Cranial nerves: There is good facial symmetry.The speech is fluent and clear. No aphasia or dysarthria. Fund of knowledge is reduced.  Recent and remote memory are impaired. Attention and  concentration are reduced.  Able to name objects and unable to repeat phrases.  Hearing is intact to conversational tone.   Sensation: Sensation is intact to light touch throughout Motor: Strength is at least antigravity x4. DTR's 2/4 in UE/LE     Movement examination: Tone: There is normal tone in the UE/LE Abnormal movements:  no tremor.  No myoclonus.  No asterixis.   Coordination:  There is no decremation with RAM's. Normal finger to nose  Gait and Station: The patient has no difficulty arising out of a deep-seated chair without the use of the hands. The patient's stride length is good.  Gait is cautious and narrow.    Thank you for allowing [place] the opportunity to participate in the care of this nice patient. Please do not hesitate to contact us for any questions or concerns.   Total time spent on today's visit was 30 minutes dedicated to this patient today, preparing to see patient, examining the patient, ordering tests and/or medications and counseling the patient, documenting clinical information in the EHR or other health record, independently interpreting results and communicating results to the patient/family, discussing treatment and goals, answering patient's questions and coordinating care.  Cc:  Mechele Dawley, PA-C  Marlowe Kays 01/02/2023 11:39 AM

## 2023-01-02 NOTE — Patient Instructions (Signed)
It was a pleasure to see you today at our office.   Recommendations:  Follow up in 6 meses Continue Memantine 10 mg twice daily.  Siga in contacto con su medico primario,    Clinical research associate actividades para hispanos, para salir de la casa y Teacher, music,     Whom to call:  Memory  decline, memory medications: Call out office [phone]   For psychiatric meds, mood meds: Please have your primary care physician manage these medications.       For assessment of decision of mental capacity and competency:  Call Dr. Erick Blinks, geriatric psychiatrist at [phone]  For guidance in geriatric dementia issues please call [facility] [phone]  For guidance regarding [facility] contact Sidney Ace, Social Worker tel: [phone]  If you have any severe symptoms of a stroke, or other severe issues such as confusion,severe chills or fever, etc call 911 or go to the ER as you may need to be evaluate further    RECOMMENDATIONS FOR ALL PATIENTS WITH MEMORY PROBLEMS: 1. Continue to exercise (Recommend 30 minutes of walking everyday, or 3 hours every week) 2. Increase social interactions - continue going to [place] and enjoy social gatherings with friends and family 3. Eat healthy, avoid fried foods and eat more fruits and vegetables 4. Maintain adequate blood pressure, blood sugar, and blood cholesterol level. Reducing the risk of stroke and cardiovascular disease also helps promoting better memory. 5. Avoid stressful situations. Live a simple life and avoid aggravations. Organize your time and prepare for the next day in anticipation. 6. Sleep well, avoid any interruptions of sleep and avoid any distractions in the bedroom that may interfere with adequate sleep quality 7. Avoid sugar, avoid sweets as there is a strong link between excessive sugar intake, diabetes, and cognitive impairment We discussed the Mediterranean diet, which has been shown to help  patients reduce the risk of progressive memory disorders and reduces cardiovascular risk. This includes eating fish, eat fruits and green leafy vegetables, nuts like almonds and hazelnuts, walnuts, and also use olive oil. Avoid fast foods and fried foods as much as possible. Avoid sweets and sugar as sugar use has been linked to worsening of memory function.  There is always a concern of gradual progression of memory problems. If this is the case, then we may need to adjust level of care according to patient needs. Support, both to the patient and caregiver, should then be put into place.    The Alzheimer's Association is here all day, every day for people facing Alzheimer's disease through our free 24/7 Helpline: [phone])080-9721. The Helpline provides reliable information and support to all those who need assistance, such as individuals living with memory loss, Alzheimer's or other dementia, caregivers, health care professionals and the public.  Our highly trained and knowledgeable staff can help you with: Understanding memory loss, dementia and Alzheimer's  Medications and other treatment options  General information about aging and brain health  Skills to provide quality care and to find the best care from professionals  Legal, financial and living-arrangement decisions Our Helpline also features: Confidential care consultation provided by master's level clinicians who can help with decision-making support, crisis assistance and education on issues families face every day  Help in a caller's preferred language using our translation service that features more than 200 languages and dialects  Referrals to local community programs, services and ongoing support     FALL PRECAUTIONS: Be cautious when walking. Scan the area for  obstacles that may increase the risk of trips and falls. When getting up in the mornings, sit up at the edge of the bed for a few minutes before getting out of bed. Consider  elevating the bed at the head end to avoid drop of blood pressure when getting up. Walk always in a well-lit room (use night lights in the walls). Avoid area rugs or power cords from appliances in the middle of the walkways. Use a walker or a cane if necessary and consider physical therapy for balance exercise. Get your eyesight checked regularly.  FINANCIAL OVERSIGHT: Supervision, especially oversight when making financial decisions or transactions is also recommended.  HOME SAFETY: Consider the safety of the kitchen when operating appliances like stoves, microwave oven, and blender. Consider having supervision and share cooking responsibilities until no longer able to participate in those. Accidents with firearms and other hazards in the house should be identified and addressed as well.   ABILITY TO BE LEFT ALONE: If patient is unable to contact 911 operator, consider using LifeLine, or when the need is there, arrange for someone to stay with patients. Smoking is a fire hazard, consider supervision or cessation. Risk of wandering should be assessed by caregiver and if detected at any point, supervision and safe proof recommendations should be instituted.  MEDICATION SUPERVISION: Inability to self-administer medication needs to be constantly addressed. Implement a mechanism to ensure safe administration of the medications.    Mediterranean Diet A Mediterranean diet refers to food and lifestyle choices that are based on the traditions of countries located on the [employer]. This way of eating has been shown to help prevent certain conditions and improve outcomes for people who have chronic diseases, like kidney disease and heart disease. What are tips for following this plan? Lifestyle  Cook and eat meals together with your family, when possible. Drink enough fluid to keep your urine clear or pale yellow. Be physically active every day. This includes: Aerobic exercise like running or  swimming. Leisure activities like gardening, walking, or housework. Get 7-8 hours of sleep each night. If recommended by your health care provider, drink red wine in moderation. This means 1 glass a day for nonpregnant women and 2 glasses a day for men. A glass of wine equals 5 oz (150 mL). Reading food labels  Check the serving size of packaged foods. For foods such as rice and pasta, the serving size refers to the amount of cooked product, not dry. Check the total fat in packaged foods. Avoid foods that have saturated fat or trans fats. Check the ingredients list for added sugars, such as corn syrup. Shopping  At the grocery store, buy most of your food from the areas near the walls of the store. This includes: Fresh fruits and vegetables (produce). Grains, beans, nuts, and seeds. Some of these may be available in unpackaged forms or large amounts (in bulk). Fresh seafood. Poultry and eggs. Low-fat dairy products. Buy whole ingredients instead of prepackaged foods. Buy fresh fruits and vegetables in-season from local farmers markets. Buy frozen fruits and vegetables in resealable bags. If you do not have access to quality fresh seafood, buy precooked frozen shrimp or canned fish, such as tuna, salmon, or sardines. Buy small amounts of raw or cooked vegetables, salads, or olives from the deli or salad bar at your store. Stock your pantry so you always have certain foods on hand, such as olive oil, canned tuna, canned tomatoes, rice, pasta, and beans. Cooking  [employer]  with extra-virgin olive oil instead of using butter or other vegetable oils. Have meat as a side dish, and have vegetables or grains as your main dish. This means having meat in small portions or adding small amounts of meat to foods like pasta or stew. Use beans or vegetables instead of meat in common dishes like chili or lasagna. Experiment with different cooking methods. Try roasting or broiling vegetables instead of  steaming or sauteing them. Add frozen vegetables to soups, stews, pasta, or rice. Add nuts or seeds for added healthy fat at each meal. You can add these to yogurt, salads, or vegetable dishes. Marinate fish or vegetables using olive oil, lemon juice, garlic, and fresh herbs. Meal planning  Plan to eat 1 vegetarian meal one day each week. Try to work up to 2 vegetarian meals, if possible. Eat seafood 2 or more times a week. Have healthy snacks readily available, such as: Vegetable sticks with hummus. Greek yogurt. Fruit and nut trail mix. Eat balanced meals throughout the week. This includes: Fruit: 2-3 servings a day Vegetables: 4-5 servings a day Low-fat dairy: 2 servings a day Fish, poultry, or lean meat: 1 serving a day Beans and legumes: 2 or more servings a week Nuts and seeds: 1-2 servings a day Whole grains: 6-8 servings a day Extra-virgin olive oil: 3-4 servings a day Limit red meat and sweets to only a few servings a month What are my food choices? Mediterranean diet Recommended Grains: Whole-grain pasta. Brown rice. Bulgar wheat. Polenta. Couscous. Whole-wheat bread. Orpah Cobb. Vegetables: Artichokes. Beets. Broccoli. Cabbage. Carrots. Eggplant. Green beans. Chard. Kale. Spinach. Onions. Leeks. Peas. Squash. Tomatoes. Peppers. Radishes. Fruits: Apples. Apricots. Avocado. Berries. Bananas. Cherries. Dates. Figs. Grapes. Lemons. Melon. Oranges. Peaches. Plums. Pomegranate. Meats and other protein foods: Beans. Almonds. Sunflower seeds. Pine nuts. Peanuts. Cod. Salmon. Scallops. Shrimp. Tuna. Tilapia. Clams. Oysters. Eggs. Dairy: Low-fat milk. Cheese. Greek yogurt. Beverages: Water. Red wine. Herbal tea. Fats and oils: Extra virgin olive oil. Avocado oil. Grape seed oil. Sweets and desserts: [place] yogurt with honey. Baked apples. Poached pears. Trail mix. Seasoning and other foods: Basil. Cilantro. Coriander. Cumin. Mint. Parsley. Sage. Rosemary. Tarragon. Garlic.  Oregano. Thyme. Pepper. Balsalmic vinegar. Tahini. Hummus. Tomato sauce. Olives. Mushrooms. Limit these Grains: Prepackaged pasta or rice dishes. Prepackaged cereal with added sugar. Vegetables: Deep fried potatoes (french fries). Fruits: Fruit canned in syrup. Meats and other protein foods: Beef. Pork. Lamb. Poultry with skin. Hot dogs. Tomasa Blase. Dairy: Ice cream. Sour cream. Whole milk. Beverages: Juice. Sugar-sweetened soft drinks. Beer. Liquor and spirits. Fats and oils: Butter. Canola oil. Vegetable oil. Beef fat (tallow). Lard. Sweets and desserts: Cookies. Cakes. Pies. Candy. Seasoning and other foods: Mayonnaise. Premade sauces and marinades. The items listed may not be a complete list. Talk with your dietitian about what dietary choices are right for you. Summary The Mediterranean diet includes both food and lifestyle choices. Eat a variety of fresh fruits and vegetables, beans, nuts, seeds, and whole grains. Limit the amount of red meat and sweets that you eat. Talk with your health care provider about whether it is safe for you to drink red wine in moderation. This means 1 glass a day for nonpregnant women and 2 glasses a day for men. A glass of wine equals 5 oz (150 mL). This information is not intended to replace advice given to you by your health care provider. Make sure you discuss any questions you have with your health care provider. Document Released: 03/06/2016 Document Revised: 04/08/2016  Document Reviewed: 03/06/2016 Elsevier Interactive Patient Education  2017 [employer].

## 2023-02-20 ENCOUNTER — Other Ambulatory Visit: Payer: Self-pay | Admitting: Physician Assistant

## 2023-03-13 ENCOUNTER — Ambulatory Visit: Payer: BC Managed Care – PPO | Admitting: Internal Medicine

## 2023-03-25 NOTE — Progress Notes (Deleted)
Cardiology Clinic Note   Patient Name: Kenneth Nixon Date of Encounter: 03/27/2023  Primary Care Provider:  Mechele Dawley, PA-C Primary Cardiologist:  Maisie Fus, MD  Patient Profile    Kenneth Nixon 77 year old male presents to the clinic today for follow-up evaluation of his atrial fibrillation.  Past Medical History    No past medical history on file. No past surgical history on file.  Allergies  No Known Allergies  History of Present Illness    Kenneth Nixon has a PMH of hypertension, hyperlipidemia, Alzheimer's disease, and atrial fibrillation.  He was initially referred to Dr. Carolan Clines for evaluation of his atrial fibrillation.  He recently moved from [place].  He reported that he had been living in the [place] for about 3 months.  He had been diagnosed with atrial fibrillation in [place].  He had been taking amiodarone 200 mg every other day.  His atrial fibrillation was noted to be rate controlled.  He was started on apixaban 5 mg twice daily and was taking the medication every 2 days.  He reported heart racing.  He denied cardiac procedures.  He denied shortness of breath and palpitations.  He had no known thyroid disease.  He denied orthopnea and PND.  He denied chest pain and dyspnea on exertion.  He reported that he was active and walks regularly.  He had no lower extremity swelling.  His TSH was normal.  He wore a cardiac event monitor which showed 35% A-fib burden.  He was continued on amiodarone and apixaban.  Is not felt that he has sleep apnea.  Echocardiogram 11/08/2021 showed an EF of 50-55%, G1 DD, mildly dilated left and right atria, mild mitral valve regurgitation.  He was seen in follow-up on 09/10/2022.  He denied significant palpitations.  He denied issues with his medications.  His amlodipine was decreased.  He presents to the clinic today for follow-up evaluation and states***.  *** denies chest pain,  shortness of breath, lower extremity edema, fatigue, palpitations, melena, hematuria, hemoptysis, diaphoresis, weakness, presyncope, syncope, orthopnea, and PND.  Atrial fibrillation-heart rate today***.  Reports compliance with apixaban.  Denies bleeding issues. Avoid triggers caffeine, chocolate, EtOH, dehydration etc. Continue amiodarone, apixaban  Essential hypertension-BP today***. Continue amlodipine Heart healthy low-sodium diet Maintain blood pressure log  Hyperlipidemia-LDL***. High-fiber diet Maintain physical activity Follows with PCP  Disposition: Follow-up with Dr. Wyline Mood or me in 6-9 months.  Home Medications    Prior to Admission medications   Medication Sig Start Date End Date Taking? Authorizing Provider  amiodarone (PACERONE) 200 MG tablet Take 200 mg by mouth daily.    [provider]  amLODipine (NORVASC) 5 MG tablet Take 1 tablet (5 mg total) by mouth daily. 09/10/22   Maisie Fus, MD  apixaban (ELIQUIS) 5 MG TABS tablet Take 5 mg by mouth 2 (two) times daily.    [provider]  Ascorbic Acid (VITAMIN C) 100 MG tablet Take 100 mg by mouth daily.    [provider]  Cholecalciferol (VITAMIN D3 PO) Take by mouth.    [provider]  Glucosamine HCl (GLUCOSAMINE PO) Take by mouth.    [provider]  memantine (NAMENDA) 10 MG tablet Take 1 tablet by mouth twice daily 02/23/23   Marcos Eke, PA-C  mirtazapine (REMERON) 7.5 MG tablet Take 1 tablet (7.5 mg total) by mouth at bedtime. 10/31/22   Glendale Chard, DO    Family History    No  family history on file. has no family status information on file.   Social History    Social History   Socioeconomic History   Marital status: Married    Spouse name: Not on file   Number of children: 5   Years of education: 12   Highest education level: Not on file  Occupational History   Not on file  Tobacco Use   Smoking status: Never   Smokeless tobacco: Never   Substance and Sexual Activity   Alcohol use: Never   Drug use: Not on file   Sexual activity: Not on file  Other Topics Concern   Not on file  Social History Narrative   Right handed   Drinks caffeine prn   Two story home   Retired lives with his wife   Spanish speaking   Social Determinants of Health   Financial Resource Strain: Not on file  Food Insecurity: No Food Insecurity (02/05/2022)   Received from [facility], [facility]   Hunger Vital Sign    Worried About Running Out of Food in the Last Year: Never true    Ran Out of Food in the Last Year: Never true  Transportation Needs: Not on file  Physical Activity: Not on file  Stress: Not on file  Social Connections: Unknown (01/23/2022)   [facility] from [facility], [facility]   Social Network    Social Network: Not on file  Intimate Partner Violence: Not At Risk (11/23/2022)   Received from [facility], [facility]   HITS    Over the last 12 months how often did your partner physically hurt you?: 1    Over the last 12 months how often did your partner insult you or talk down to you?: 1    Over the last 12 months how often did your partner threaten you with physical harm?: 1    Over the last 12 months how often did your partner scream or curse at you?: 1     Review of Systems    General:  No chills, fever, night sweats or weight changes.  Cardiovascular:  No chest pain, dyspnea on exertion, edema, orthopnea, palpitations, paroxysmal nocturnal dyspnea. Dermatological: No rash, lesions/masses Respiratory: No cough, dyspnea Urologic: No hematuria, dysuria Abdominal:   No nausea, vomiting, diarrhea, bright red blood per rectum, melena, or hematemesis Neurologic:  No visual changes, wkns, changes in mental status. All other systems reviewed and are otherwise negative except as noted above.  Physical Exam    VS:  There were no vitals taken for this visit. , BMI There is no height or weight on file to  calculate BMI. GEN: Well nourished, well developed, in no acute distress. HEENT: normal. Neck: Supple, no JVD, carotid bruits, or masses. Cardiac: RRR, no murmurs, rubs, or gallops. No clubbing, cyanosis, edema.  Radials/DP/PT 2+ and equal bilaterally.  Respiratory:  Respirations regular and unlabored, clear to auscultation bilaterally. GI: Soft, nontender, nondistended, BS + x 4. MS: no deformity or atrophy. Skin: warm and dry, no rash. Neuro:  Strength and sensation are intact. Psych: Normal affect.  Accessory Clinical Findings    Recent Labs: No results found for requested labs within last 365 days.   Recent Lipid Panel No results found for: "CHOL", "TRIG", "HDL", "CHOLHDL", "VLDL", "LDLCALC", "LDLDIRECT"  No BP recorded.  {Refresh Note OR Click here to enter BP  :1}***    ECG personally reviewed by me today- ***    Cardiac event monitor 11/28/2021 Agree with ziopatch  below. 35% burden of atrial fibrillation. Triggered sinus rhythm   Patch Wear Time:  14 days and 0 hours (2023-04-16T19:15:28-0400 to 2023-04-30T19:15:32-0400)   Patient had a min HR of 43 bpm, max HR of 180 bpm, and avg HR of 69 bpm. Predominant underlying rhythm was Sinus Rhythm. Atrial Fibrillation/Flutter occurred (35% burden), ranging from 47-180 bpm (avg of 86 bpm), the longest lasting 4 hours 58 mins with  an avg rate of 74 bpm. Atrial Fibrillation/Flutter was present at activation of device. Atrial Fibrillation/Flutter was present at de-activation of device. Isolated SVEs were rare (<1.0%), SVE Couplets were rare (<1.0%), and SVE Triplets were rare  (<1.0%). Isolated VEs were rare (<1.0%), and no VE Couplets or VE Triplets were present. Ventricular Trigeminy was present.  Echocardiogram 11/08/2021  IMPRESSIONS     1. Left ventricular ejection fraction, by estimation, is 50 to 55%. The  left ventricle has low normal function. The left ventricle has no regional  wall motion abnormalities. Left ventricular  diastolic parameters are  consistent with Grade I diastolic  dysfunction (impaired relaxation). The average left ventricular global  longitudinal strain is -20.1 %. The global longitudinal strain is normal.   2. Right ventricular systolic function is normal. The right ventricular  size is normal. There is normal pulmonary artery systolic pressure.   3. Left atrial size was mildly dilated.   4. Right atrial size was mildly dilated.   5. Mild mitral valve regurgitation.   6. Aortic valve regurgitation is mild to moderate. Aortic valve sclerosis  is present, with no evidence of aortic valve stenosis.   7. The inferior vena cava is dilated in size with >50% respiratory  variability, suggesting right atrial pressure of 8 mmHg.   Comparison(s): No prior Echocardiogram.   FINDINGS   Left Ventricle: Left ventricular ejection fraction, by estimation, is 50  to 55%. The left ventricle has low normal function. The left ventricle has  no regional wall motion abnormalities. The average left ventricular global  longitudinal strain is -20.1  %. The global longitudinal strain is normal. The left ventricular internal  cavity size was normal in size. There is no left ventricular hypertrophy.  Left ventricular diastolic parameters are consistent with Grade I  diastolic dysfunction (impaired  relaxation).   Right Ventricle: The right ventricular size is normal. No increase in  right ventricular wall thickness. Right ventricular systolic function is  normal. There is normal pulmonary artery systolic pressure. The tricuspid  regurgitant velocity is 2.38 m/s, and   with an assumed right atrial pressure of 8 mmHg, the estimated right  ventricular systolic pressure is 30.7 mmHg.   Left Atrium: Left atrial size was mildly dilated.   Right Atrium: Right atrial size was mildly dilated.   Pericardium: There is no evidence of pericardial effusion.   Mitral Valve: Mild mitral valve regurgitation.    Tricuspid Valve: Tricuspid valve regurgitation is trivial.   Aortic Valve: Aortic valve regurgitation is mild to moderate. Aortic  regurgitation PHT measures 714 msec. Aortic valve sclerosis is present,  with no evidence of aortic valve stenosis.   Pulmonic Valve: The pulmonic valve was not well visualized. Pulmonic valve  regurgitation is not visualized.   Aorta: The aortic root and ascending aorta are structurally normal, with  no evidence of dilitation.   Venous: The inferior vena cava is dilated in size with greater than 50%  respiratory variability, suggesting right atrial pressure of 8 mmHg.   IAS/Shunts: No atrial level shunt detected by color flow Doppler.  Assessment & Plan   1.  ***   Thomasene Ripple. Hedi Barkan NP-C     03/27/2023, 6:44 AM [facility] [address] Office [phone] Fax [phone]    I spent***minutes examining this patient, reviewing medications, and using patient centered shared decision making involving her cardiac care.  Prior to her visit I spent greater than 20 minutes reviewing her past medical history,  medications, and prior cardiac tests.

## 2023-03-31 ENCOUNTER — Ambulatory Visit: Payer: BC Managed Care – PPO | Attending: General Practice | Admitting: General Practice

## 2023-04-02 ENCOUNTER — Encounter: Payer: Self-pay | Admitting: General Practice

## 2023-04-02 ENCOUNTER — Other Ambulatory Visit: Payer: Self-pay | Admitting: Physician Assistant

## 2023-04-06 NOTE — Telephone Encounter (Signed)
Patient is needing refill on Memantine 10mg 

## 2023-04-06 NOTE — Telephone Encounter (Signed)
Pt's daughter called in stating the pharmacy keeps telling her they do not have any refills left on the memantine. She needs refills to be sent to [facility]

## 2023-06-02 DIAGNOSIS — R102 Pelvic and perineal pain: Secondary | ICD-10-CM | POA: Diagnosis not present

## 2023-06-02 DIAGNOSIS — M25551 Pain in right hip: Secondary | ICD-10-CM | POA: Diagnosis not present

## 2023-06-02 DIAGNOSIS — Z23 Encounter for immunization: Secondary | ICD-10-CM | POA: Diagnosis not present

## 2023-06-02 DIAGNOSIS — M6530 Trigger finger, unspecified finger: Secondary | ICD-10-CM | POA: Diagnosis not present

## 2023-06-03 ENCOUNTER — Other Ambulatory Visit (HOSPITAL_BASED_OUTPATIENT_CLINIC_OR_DEPARTMENT_OTHER): Payer: Self-pay | Admitting: Physician Assistant

## 2023-06-03 DIAGNOSIS — M25551 Pain in right hip: Secondary | ICD-10-CM

## 2023-06-22 ENCOUNTER — Ambulatory Visit (HOSPITAL_BASED_OUTPATIENT_CLINIC_OR_DEPARTMENT_OTHER)
Admission: RE | Admit: 2023-06-22 | Discharge: 2023-06-22 | Disposition: A | Discharge status: Home / Self Care | Payer: BC Managed Care – PPO | Source: Ambulatory Visit | Attending: Physician Assistant | Admitting: Physician Assistant

## 2023-06-22 DIAGNOSIS — M25551 Pain in right hip: Secondary | ICD-10-CM | POA: Diagnosis not present

## 2023-06-22 DIAGNOSIS — M25552 Pain in left hip: Secondary | ICD-10-CM | POA: Diagnosis not present

## 2023-07-07 ENCOUNTER — Encounter: Payer: Self-pay | Admitting: Physician Assistant

## 2023-07-07 ENCOUNTER — Ambulatory Visit: Payer: BC Managed Care – PPO | Admitting: Physician Assistant

## 2023-07-07 DIAGNOSIS — F03918 Unspecified dementia, unspecified severity, with other behavioral disturbance: Secondary | ICD-10-CM

## 2023-07-07 MED ORDER — MEMANTINE HCL 10 MG PO TABS: 10.0000 mg | ORAL_TABLET | Freq: Two times a day (BID) | ORAL | 3 refills | Status: DC

## 2023-07-07 MED ORDER — DIVALPROEX SODIUM 125 MG PO DR TAB: 125.0000 mg | DELAYED_RELEASE_TABLET | Freq: Two times a day (BID) | ORAL | 11 refills | Status: DC

## 2023-07-07 NOTE — Patient Instructions (Addendum)
It was a pleasure to see you today at our office.   Recommendations:  Follow up in 6 meses Continue Memantine 10 mg twice daily.  Start Depakote 125 mg 1 tab at night, may increase to 1 tab 125 mg twice a day for sundowning, hallucinations side effects discussed.  Siga in contacto con su medico primario,    Clinical research associate actividades para hispanos, para salir de la casa y Teacher, music,     Whom to call:  Memory  decline, memory medications: Call out office [phone]   For psychiatric meds, mood meds: Please have your primary care physician manage these medications.       For assessment of decision of mental capacity and competency:  Call Dr. Erick Blinks, geriatric psychiatrist at [phone]  For guidance in geriatric dementia issues please call [facility] [phone]  For guidance regarding [facility] contact Sidney Ace, Social Worker tel: [phone]  If you have any severe symptoms of a stroke, or other severe issues such as confusion,severe chills or fever, etc call 911 or go to the ER as you may need to be evaluate further    RECOMMENDATIONS FOR ALL PATIENTS WITH MEMORY PROBLEMS: 1. Continue to exercise (Recommend 30 minutes of walking everyday, or 3 hours every week) 2. Increase social interactions - continue going to [place] and enjoy social gatherings with friends and family 3. Eat healthy, avoid fried foods and eat more fruits and vegetables 4. Maintain adequate blood pressure, blood sugar, and blood cholesterol level. Reducing the risk of stroke and cardiovascular disease also helps promoting better memory. 5. Avoid stressful situations. Live a simple life and avoid aggravations. Organize your time and prepare for the next day in anticipation. 6. Sleep well, avoid any interruptions of sleep and avoid any distractions in the bedroom that may interfere with adequate sleep quality 7. Avoid sugar, avoid sweets as there is a strong link  between excessive sugar intake, diabetes, and cognitive impairment We discussed the Mediterranean diet, which has been shown to help patients reduce the risk of progressive memory disorders and reduces cardiovascular risk. This includes eating fish, eat fruits and green leafy vegetables, nuts like almonds and hazelnuts, walnuts, and also use olive oil. Avoid fast foods and fried foods as much as possible. Avoid sweets and sugar as sugar use has been linked to worsening of memory function.  There is always a concern of gradual progression of memory problems. If this is the case, then we may need to adjust level of care according to patient needs. Support, both to the patient and caregiver, should then be put into place.    The Alzheimer's Association is here all day, every day for people facing Alzheimer's disease through our free 24/7 Helpline: 708-162-4152. The Helpline provides reliable information and support to all those who need assistance, such as individuals living with memory loss, Alzheimer's or other dementia, caregivers, health care professionals and the public.  Our highly trained and knowledgeable staff can help you with: Understanding memory loss, dementia and Alzheimer's  Medications and other treatment options  General information about aging and brain health  Skills to provide quality care and to find the best care from professionals  Legal, financial and living-arrangement decisions Our Helpline also features: Confidential care consultation provided by master's level clinicians who can help with decision-making support, crisis assistance and education on issues families face every day  Help in a caller's preferred language using our translation service that features more than 200 languages and  dialects  Referrals to local community programs, services and ongoing support     FALL PRECAUTIONS: Be cautious when walking. Scan the area for obstacles that may increase the risk of  trips and falls. When getting up in the mornings, sit up at the edge of the bed for a few minutes before getting out of bed. Consider elevating the bed at the head end to avoid drop of blood pressure when getting up. Walk always in a well-lit room (use night lights in the walls). Avoid area rugs or power cords from appliances in the middle of the walkways. Use a walker or a cane if necessary and consider physical therapy for balance exercise. Get your eyesight checked regularly.  FINANCIAL OVERSIGHT: Supervision, especially oversight when making financial decisions or transactions is also recommended.  HOME SAFETY: Consider the safety of the kitchen when operating appliances like stoves, microwave oven, and blender. Consider having supervision and share cooking responsibilities until no longer able to participate in those. Accidents with firearms and other hazards in the house should be identified and addressed as well.   ABILITY TO BE LEFT ALONE: If patient is unable to contact 911 operator, consider using LifeLine, or when the need is there, arrange for someone to stay with patients. Smoking is a fire hazard, consider supervision or cessation. Risk of wandering should be assessed by caregiver and if detected at any point, supervision and safe proof recommendations should be instituted.  MEDICATION SUPERVISION: Inability to self-administer medication needs to be constantly addressed. Implement a mechanism to ensure safe administration of the medications.    Mediterranean Diet A Mediterranean diet refers to food and lifestyle choices that are based on the traditions of countries located on the [employer]. This way of eating has been shown to help prevent certain conditions and improve outcomes for people who have chronic diseases, like kidney disease and heart disease. What are tips for following this plan? Lifestyle  Cook and eat meals together with your family, when possible. Drink enough  fluid to keep your urine clear or pale yellow. Be physically active every day. This includes: Aerobic exercise like running or swimming. Leisure activities like gardening, walking, or housework. Get 7-8 hours of sleep each night. If recommended by your health care provider, drink red wine in moderation. This means 1 glass a day for nonpregnant women and 2 glasses a day for men. A glass of wine equals 5 oz (150 mL). Reading food labels  Check the serving size of packaged foods. For foods such as rice and pasta, the serving size refers to the amount of cooked product, not dry. Check the total fat in packaged foods. Avoid foods that have saturated fat or trans fats. Check the ingredients list for added sugars, such as corn syrup. Shopping  At the grocery store, buy most of your food from the areas near the walls of the store. This includes: Fresh fruits and vegetables (produce). Grains, beans, nuts, and seeds. Some of these may be available in unpackaged forms or large amounts (in bulk). Fresh seafood. Poultry and eggs. Low-fat dairy products. Buy whole ingredients instead of prepackaged foods. Buy fresh fruits and vegetables in-season from local farmers markets. Buy frozen fruits and vegetables in resealable bags. If you do not have access to quality fresh seafood, buy precooked frozen shrimp or canned fish, such as tuna, salmon, or sardines. Buy small amounts of raw or cooked vegetables, salads, or olives from the deli or salad bar at your store. Stock your  pantry so you always have certain foods on hand, such as olive oil, canned tuna, canned tomatoes, rice, pasta, and beans. Cooking  Cook foods with extra-virgin olive oil instead of using butter or other vegetable oils. Have meat as a side dish, and have vegetables or grains as your main dish. This means having meat in small portions or adding small amounts of meat to foods like pasta or stew. Use beans or vegetables instead of meat in  common dishes like chili or lasagna. Experiment with different cooking methods. Try roasting or broiling vegetables instead of steaming or sauteing them. Add frozen vegetables to soups, stews, pasta, or rice. Add nuts or seeds for added healthy fat at each meal. You can add these to yogurt, salads, or vegetable dishes. Marinate fish or vegetables using olive oil, lemon juice, garlic, and fresh herbs. Meal planning  Plan to eat 1 vegetarian meal one day each week. Try to work up to 2 vegetarian meals, if possible. Eat seafood 2 or more times a week. Have healthy snacks readily available, such as: Vegetable sticks with hummus. Greek yogurt. Fruit and nut trail mix. Eat balanced meals throughout the week. This includes: Fruit: 2-3 servings a day Vegetables: 4-5 servings a day Low-fat dairy: 2 servings a day Fish, poultry, or lean meat: 1 serving a day Beans and legumes: 2 or more servings a week Nuts and seeds: 1-2 servings a day Whole grains: 6-8 servings a day Extra-virgin olive oil: 3-4 servings a day Limit red meat and sweets to only a few servings a month What are my food choices? Mediterranean diet Recommended Grains: Whole-grain pasta. Brown rice. Bulgar wheat. Polenta. Couscous. Whole-wheat bread. Orpah Cobb. Vegetables: Artichokes. Beets. Broccoli. Cabbage. Carrots. Eggplant. Green beans. Chard. Kale. Spinach. Onions. Leeks. Peas. Squash. Tomatoes. Peppers. Radishes. Fruits: Apples. Apricots. Avocado. Berries. Bananas. Cherries. Dates. Figs. Grapes. Lemons. Melon. Oranges. Peaches. Plums. Pomegranate. Meats and other protein foods: Beans. Almonds. Sunflower seeds. Pine nuts. Peanuts. Cod. Salmon. Scallops. Shrimp. Tuna. Tilapia. Clams. Oysters. Eggs. Dairy: Low-fat milk. Cheese. Greek yogurt. Beverages: Water. Red wine. Herbal tea. Fats and oils: Extra virgin olive oil. Avocado oil. Grape seed oil. Sweets and desserts: [place] yogurt with honey. Baked apples. Poached pears.  Trail mix. Seasoning and other foods: Basil. Cilantro. Coriander. Cumin. Mint. Parsley. Sage. Rosemary. Tarragon. Garlic. Oregano. Thyme. Pepper. Balsalmic vinegar. Tahini. Hummus. Tomato sauce. Olives. Mushrooms. Limit these Grains: Prepackaged pasta or rice dishes. Prepackaged cereal with added sugar. Vegetables: Deep fried potatoes (french fries). Fruits: Fruit canned in syrup. Meats and other protein foods: Beef. Pork. Lamb. Poultry with skin. Hot dogs. Tomasa Blase. Dairy: Ice cream. Sour cream. Whole milk. Beverages: Juice. Sugar-sweetened soft drinks. Beer. Liquor and spirits. Fats and oils: Butter. Canola oil. Vegetable oil. Beef fat (tallow). Lard. Sweets and desserts: Cookies. Cakes. Pies. Candy. Seasoning and other foods: Mayonnaise. Premade sauces and marinades. The items listed may not be a complete list. Talk with your dietitian about what dietary choices are right for you. Summary The Mediterranean diet includes both food and lifestyle choices. Eat a variety of fresh fruits and vegetables, beans, nuts, seeds, and whole grains. Limit the amount of red meat and sweets that you eat. Talk with your health care provider about whether it is safe for you to drink red wine in moderation. This means 1 glass a day for nonpregnant women and 2 glasses a day for men. A glass of wine equals 5 oz (150 mL). This information is not intended to replace advice given to  you by your health care provider. Make sure you discuss any questions you have with your health care provider. Document Released: 03/06/2016 Document Revised: 04/08/2016 Document Reviewed: 03/06/2016 Elsevier Interactive Patient Education  2017 [employer].

## 2023-07-07 NOTE — Progress Notes (Signed)
Assessment/Plan:   Dementia likely due to Alzheimer's disease with behavioral disturbance   Kenneth Nixon is a very pleasant 77 y.o. RH manage speaking male with a history of hypertension, prostate cancer, PAF, hyperlipidemia, bradycardia, history of dementia likely due to Alzheimer's disease seen today in follow up for memory loss. Patient is currently on memantine 10 mg twice daily.  Memory shows decline, unable to add another agent due to history of bradycardia.  He tries to remain active and participate in his ADLs but needs more assistance than prior. He has some sundowning's as well.  He attends adult day program and had sown some irritability.    Follow up in  6 months. Continue memantine 10 mg twice daily, side effects discussed Start Depakote, Take 1 tab at night , may increase to 1 tab twice a day if needed  for sundowning Recommend good control of her cardiovascular risk factors.  Continue Eliquis Continue to control mood as per PCP Continue socialization attending adult day program    Subjective:    This patient is accompanied in the office by his wife and daughter who supplements the history.  Previous records as well as any outside records available were reviewed prior to todays visit. Patient was last seen on 01/02/2023   Any changes in memory since last visit?  "Worse"-wife says.  He likes to read extensively "the same book over and over because he forgets that he had read that before ".  He likes to play music to past the time, he has more difficulty with comprehension than prior. He follows his wife everywhere in the house. "He has become very childish".  repeats oneself?  Endorsed, "very frequently " Disoriented when walking into a room?  When going to another house he may be very disoriented.    Leaving objects? Endorsed, hoards more, placing for ex glasses in the freezer.    Wandering behavior?  His wife always him to prevent him from wandering off. Any  worsening depression?:  Denies.   Hallucinations or paranoia? He is more verbal, with "less filter", has more childish attitude than before.  He is stressed and anxious,stubborn, very irritable, very scared of the dark. Paranoid about his belongings, "he thinks people will steal from him"  Seizures? denies    Any sleep changes?  Sleeps well.  Denies vivid dreams, REM behavior or sleepwalking   Sleep apnea?   Denies.   Any hygiene concerns?  He needs reminder to bathe, because he thinks he already did.  He does not know how to put on his own clothing. Independent of bathing and dressing?  He needs assistance to get dressed.  Does the patient needs help with medications?  Wife is in charge   Who is in charge of the finances?  Wife is in charge     Any changes in appetite? Eats poorly, favors sweets.He forgets he ate and eats again.     Patient have trouble swallowing? Denies.   Does the patient cook? No Any headaches?   denies   Chronic back pain  denies   Ambulates with difficulty? Denies.    Recent falls or head injuries? denies     Unilateral weakness, numbness or tingling? denies   Any tremors?  Denies   Any anosmia?  Denies   Any incontinence of urine?  He has prolonged urination due to prostate issues. Any bowel dysfunction?   Denies      Patient lives with his wife  Does the  patient drive?  Has not driven since coming to the [place]      Initial Visit 08/29/21 The patient is seen in neurologic consultation at the request of Mechele Dawley, PA-C for the evaluation of memory.  The patient is accompanied by his wife who supplements the history. This is a 77 y.o. year old RH  male who recently moved in December 2022 from [place] to live with his son.he carries a diagnosis of dementia for at least 2 years.  Initially he reports that his memory problems become around 40 years ago, when he was having difficulties remembering conversations or recent events.  His memory issues became worse 2  years ago, for which his neurologist started him on memantine 10 mg daily, which he tolerated well.  Since arriving here, his son noticed worsening of the patient's memory.  His wife states that he repeats himself frequently, and has shown decrease organization skills.  "He used to be very organized, but over the last 2 years this is less ".  He denies being disoriented when walking into the room, or leaving objects in unusual places.  He ambulates without difficulty, denies any recent falls or head injuries.  He no longer drives.  His mood t is good, denies depression, but at times becomes very anxious, missing [place].  He likes to read extensively especially before going to sleep.  He sleeps well, his wife states that sometimes he talks in his sleep, but there is no other REM behavior, or sleepwalking.  No hallucinations.  She states that although no frank paranoia, he has a fear about being alone.  There are no hygiene concerns, he enjoys bathing twice a day.  However, he needs to be told what goals first when he has to get dressed.  He is wife prepares the medications in a pillbox.  They do the finances together.  His appetite is good, denies trouble swallowing.  He used to cook, but he no longer does so.  He does not use the stove, but he does leave the fridge open on the faucet on especially over the last 6 months.  Denies headaches, double vision, dizziness, focal numbness or tingling, unilateral weakness, tremors or anosmia. No history of seizures.  He reports some urge incontinence, denies constipation or diarrhea. Denies OSA, ETOH or Tobacco. Family History remarkable for paternal and with Alzheimer's disease at 89.   PREVIOUS MEDICATIONS:   CURRENT MEDICATIONS:  Outpatient Encounter Medications as of 07/07/2023  Medication Sig   amiodarone (PACERONE) 200 MG tablet Take 200 mg by mouth daily.   amLODipine (NORVASC) 5 MG tablet Take 1 tablet (5 mg total) by mouth daily.   apixaban (ELIQUIS) 5  MG TABS tablet Take 5 mg by mouth 2 (two) times daily.   Ascorbic Acid (VITAMIN C) 100 MG tablet Take 100 mg by mouth daily.   Cholecalciferol (VITAMIN D3 PO) Take by mouth.   divalproex (DEPAKOTE) 125 MG DR tablet Take 1 tablet (125 mg total) by mouth 2 (two) times daily.   Glucosamine HCl (GLUCOSAMINE PO) Take by mouth.   memantine (NAMENDA) 10 MG tablet Take 1 tablet (10 mg total) by mouth 2 (two) times daily.   mirtazapine (REMERON) 7.5 MG tablet Take 1 tablet (7.5 mg total) by mouth at bedtime.   [DISCONTINUED] memantine (NAMENDA) 10 MG tablet Take 1 tablet by mouth twice daily   No facility-administered encounter medications on file as of 07/07/2023.       06/24/2022    9:00 AM  MMSE - Mini Mental State Exam  Orientation to time 0  Orientation to Place 1  Registration 3  Attention/ Calculation 3  Recall 0  Language- name 2 objects 2  Language- repeat 1  Language- follow 3 step command 3  Language- read & follow direction 1  Write a sentence 1  Copy design 0  Total score 15      09/16/2021   10:00 AM  Montreal Cognitive Assessment   Visuospatial/ Executive (0/5) 0  Naming (0/3) 3  Attention: Read list of digits (0/2) 1  Attention: Read list of letters (0/1) 1  Attention: Serial 7 subtraction starting at 100 (0/3) 3  Language: Repeat phrase (0/2) 1  Language : Fluency (0/1) 0  Abstraction (0/2) 1  Delayed Recall (0/5) 2  Orientation (0/6) 2  Total 14  Adjusted Score (based on education) 14    Objective:     PHYSICAL EXAMINATION:    VITALS:  There were no vitals filed for this visit.  GEN:  The patient appears stated age and is in NAD. HEENT:  Normocephalic, atraumatic.   Neurological examination:  General: NAD, well-groomed, appears stated age. Orientation: The patient is alert. Oriented to person, not to place and date Cranial nerves: There is good facial symmetry.The speech is fluent and clear. No aphasia or dysarthria. Fund of knowledge is reduced.  Recent and remote memory are impaired. Attention and concentration are reduced.  Able to name objects and repeat phrases.  Hearing is intact to conversational tone.  Sensation: Sensation is intact to light touch throughout Motor: Strength is at least antigravity x4. DTR's 2/4 in UE/LE     Movement examination: Tone: There is normal tone in the UE/LE Abnormal movements:  no tremor.  No myoclonus.  No asterixis.   Coordination:  There is no decremation with RAM's. Normal finger to nose  Gait and Station: The patient has no difficulty arising out of a deep-seated chair without the use of the hands. The patient's stride length is good.  Gait is cautious and narrow.    Thank you for allowing [place] the opportunity to participate in the care of this nice patient. Please do not hesitate to contact us for any questions or concerns.   Total time spent on today's visit was 45 minutes dedicated to this patient today, preparing to see patient, examining the patient, ordering tests and/or medications and counseling the patient, documenting clinical information in the EHR or other health record, independently interpreting results and communicating results to the patient/family, discussing treatment and goals, answering patient's questions and coordinating care.  Cc:  Mechele Dawley, PA-C  Marlowe Kays 07/07/2023 5:25 PM

## 2023-11-30 IMAGING — MR MR HEAD W/O CM
10 series · 48 of 48 positions shown · non-contrast
Comparison: None Available.

CLINICAL DATA: Memory loss.

EXAM:
MRI HEAD WITHOUT CONTRAST
TECHNIQUE: Multiplanar, multiecho pulse sequences of the brain and surrounding
structures were obtained without intravenous contrast.

[Series 2: DWI · axial · 3.0mm · 1.46mm/px · z∈[-29,+131]mm · 9 of 109 slices shown (1 of 4)]
[im 1/109]
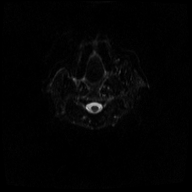
[im 14/109]
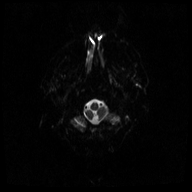
[im 28/109]
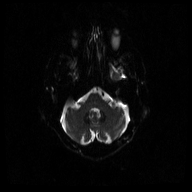
[im 41/109]
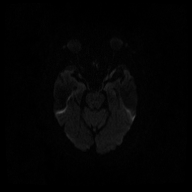
[im 55/109]
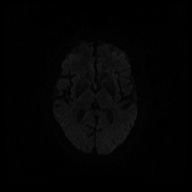
[im 68/109]
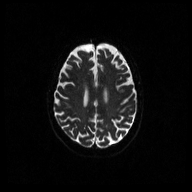
[im 82/109]
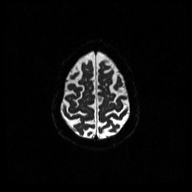
[im 95/109]
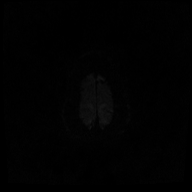
[im 109/109]
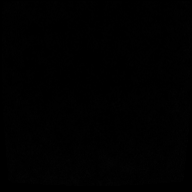

[Series 3: DWI · axial · 3.0mm · 1.46mm/px · z∈[-29,+131]mm · 5 of 55 slices shown (2 of 4)]
[im 1/55]
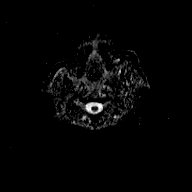
[im 14/55]
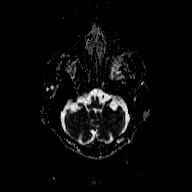
[im 28/55]
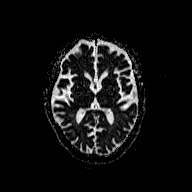
[im 41/55]
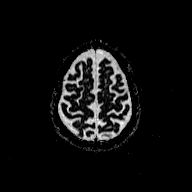
[im 55/55]
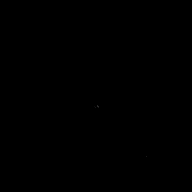

[Series 4: DWI · coronal · 5.0mm · 1.46mm/px · 6 of 66 slices shown (3 of 4)]
[im 1/66]
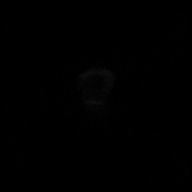
[im 14/66]
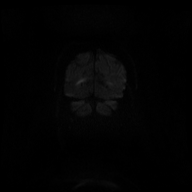
[im 27/66]
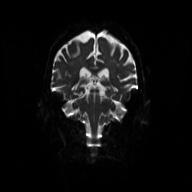
[im 40/66]
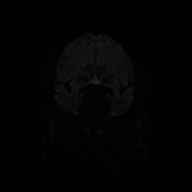
[im 53/66]
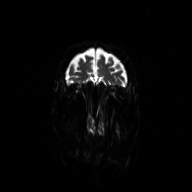
[im 66/66]
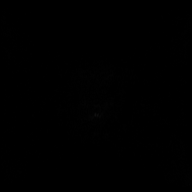

[Series 5: DWI · coronal · 5.0mm · 1.46mm/px · 3 of 32 slices shown (4 of 4)]
[im 1/32]
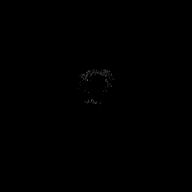
[im 16/32]
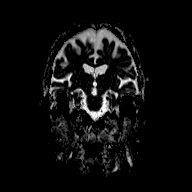
[im 32/32]
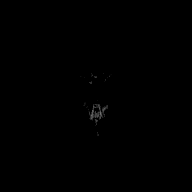

[Series 6: T2 · axial · 5.0mm · 0.72mm/px · z∈[-26,+128]mm · 2 of 23 slices shown (1 of 3)]
[im 1/23]
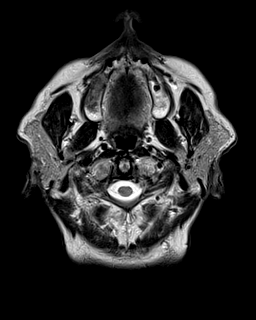
[im 23/23]
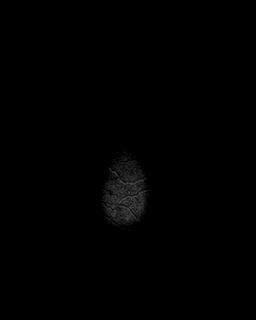

[Series 7: T1 · sagittal · 5.0mm · 0.45mm/px · 2 of 26 slices shown (1 of 2)]
[im 1/26]
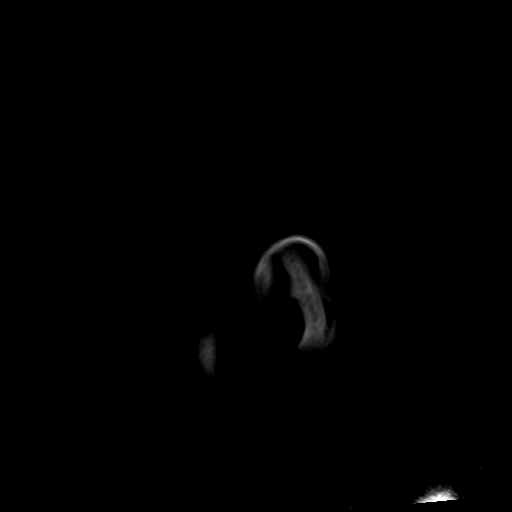
[im 26/26]
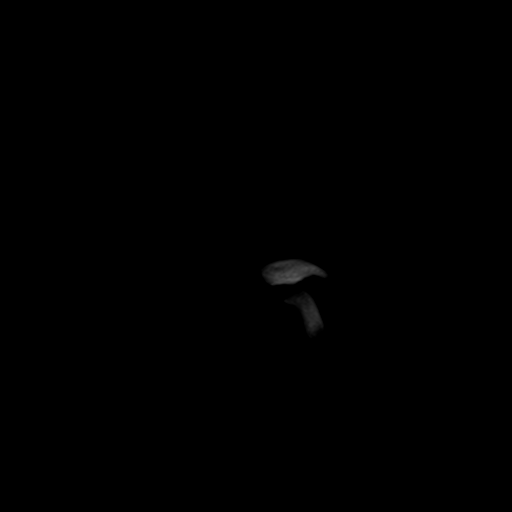

[Series 8: FLAIR · axial · 3.0mm · 0.45mm/px · z∈[-24,+126]mm · 4 of 51 slices shown]
[im 1/51]
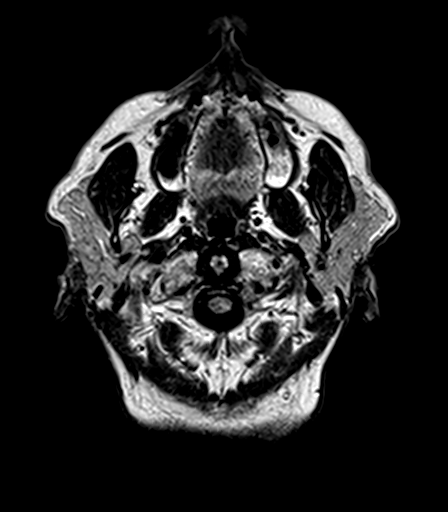
[im 17/51]
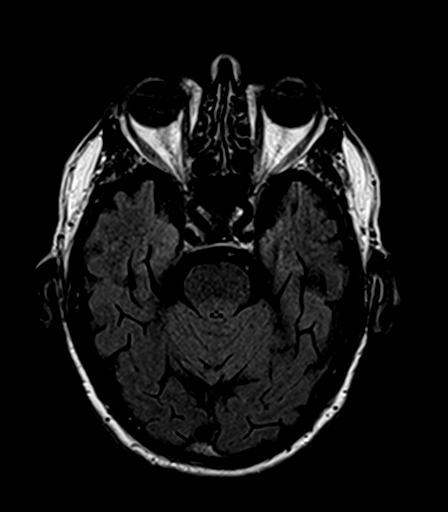
[im 34/51]
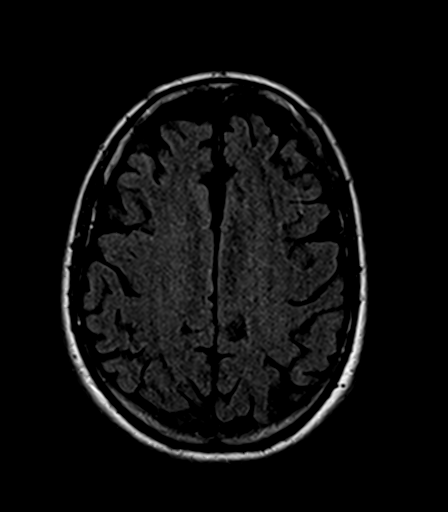
[im 51/51]
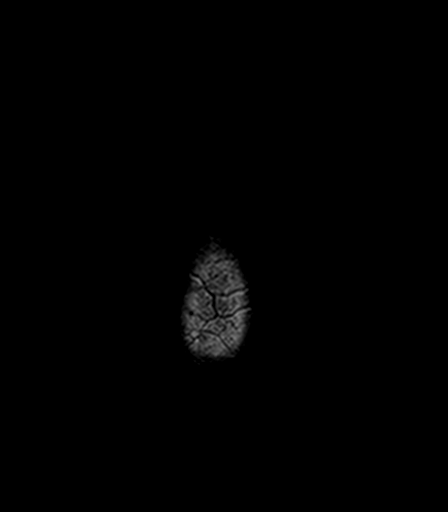

[Series 9: T1 · axial · 1.0mm · 0.94mm/px · z∈[-28,+131]mm · 13 of 160 slices shown (2 of 2)]
[im 1/160]
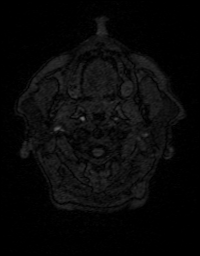
[im 14/160]
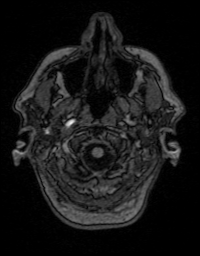
[im 27/160]
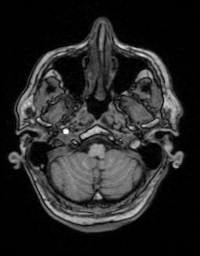
[im 40/160]
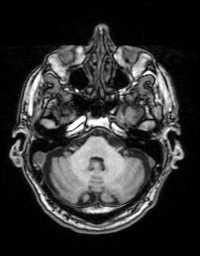
[im 54/160]
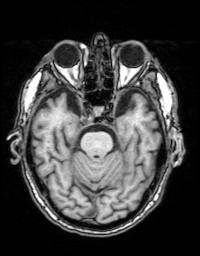
[im 67/160]
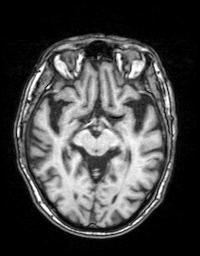
[im 80/160]
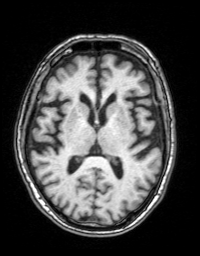
[im 93/160]
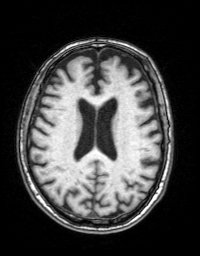
[im 107/160]
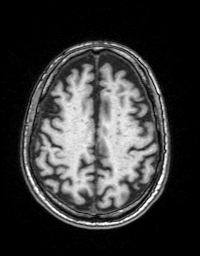
[im 120/160]
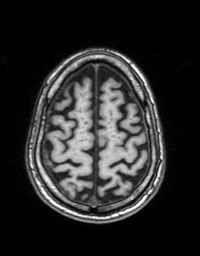
[im 133/160]
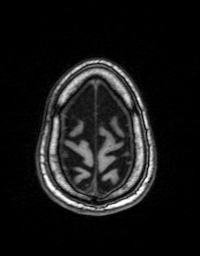
[im 146/160]
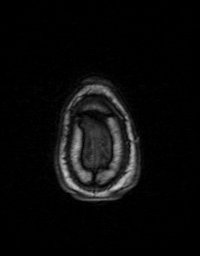
[im 160/160]
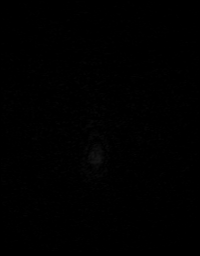

[Series 10: T2 · coronal · 5.0mm · 0.43mm/px · 2 of 29 slices shown (2 of 3)]
[im 1/29]
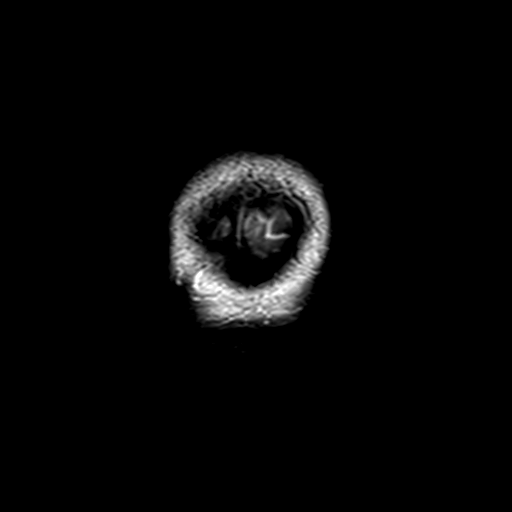
[im 29/29]
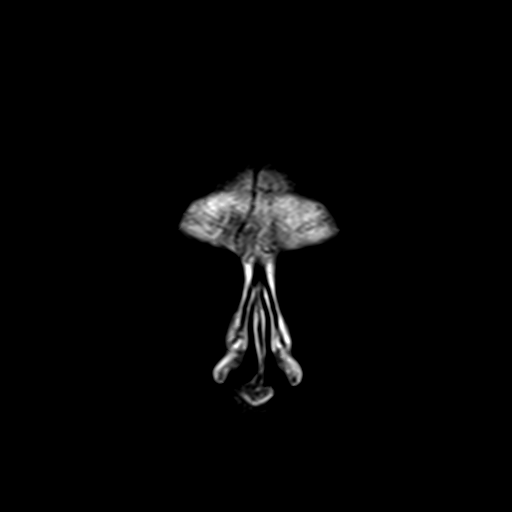

[Series 11: T2 · axial · 5.0mm · 0.72mm/px · z∈[-26,+128]mm · 2 of 23 slices shown (3 of 3)]
[im 1/23]
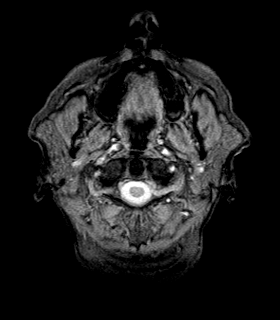
[im 23/23]
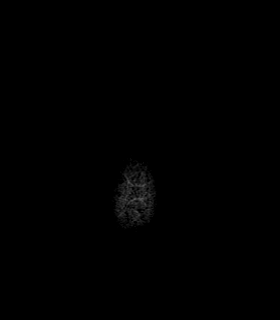

[48 of 48 positions shown; findings below may reference images not displayed]

FINDINGS: Brain: There is no evidence of an acute infarct, mass, midline
shift, or extra-axial fluid collection. Chronic microhemorrhages are
noted in the right frontal and left parieto-occipital regions. A few
small T2 hyperintensities in the cerebral white matter are within
normal limits for age. There is mild cerebral atrophy without lobar
predilection.

Vascular: Major intracranial vascular flow voids are preserved.

Skull and upper cervical spine: Unremarkable bone marrow signal para

Sinuses/Orbits: Unremarkable orbits. Minimal mucosal thickening in
the paranasal sinuses. Clear mastoid air cells.

Other: None.
IMPRESSION: 1. No acute intracranial abnormality.
2. Mild generalized cerebral atrophy.

## 2023-12-04 ENCOUNTER — Ambulatory Visit: Attending: Nurse Practitioner | Admitting: Nurse Practitioner

## 2023-12-04 ENCOUNTER — Telehealth: Payer: Self-pay

## 2023-12-04 ENCOUNTER — Encounter: Payer: Self-pay | Admitting: Nurse Practitioner

## 2023-12-04 ENCOUNTER — Other Ambulatory Visit (HOSPITAL_COMMUNITY): Payer: Self-pay

## 2023-12-04 VITALS — BP 128/80 | HR 64 | Ht 68.0 in | Wt 150.2 lb

## 2023-12-04 DIAGNOSIS — E782 Mixed hyperlipidemia: Secondary | ICD-10-CM | POA: Diagnosis not present

## 2023-12-04 DIAGNOSIS — I340 Nonrheumatic mitral (valve) insufficiency: Secondary | ICD-10-CM

## 2023-12-04 DIAGNOSIS — I10 Essential (primary) hypertension: Secondary | ICD-10-CM

## 2023-12-04 DIAGNOSIS — I480 Paroxysmal atrial fibrillation: Secondary | ICD-10-CM

## 2023-12-04 DIAGNOSIS — D6859 Other primary thrombophilia: Secondary | ICD-10-CM

## 2023-12-04 DIAGNOSIS — I351 Nonrheumatic aortic (valve) insufficiency: Secondary | ICD-10-CM

## 2023-12-04 MED ORDER — APIXABAN 5 MG PO TABS: 5.0000 mg | ORAL_TABLET | Freq: Two times a day (BID) | ORAL | 3 refills | Status: AC

## 2023-12-04 NOTE — Telephone Encounter (Signed)
 Pharmacy Patient Advocate Encounter  Insurance verification completed.   The patient is insured through [employer]   Ran test claim for [employer]. Currently a quantity of 60 is a 30 day supply and the co-pay is $297.34  . The current 30 day co-pay is, $297.34.  No PA needed at this time.  This test claim was processed through [facility]- copay amounts may vary at other pharmacies due to pharmacy/plan contracts, or as the patient moves through the different stages of their insurance plan.

## 2023-12-04 NOTE — Patient Instructions (Addendum)
 Medication Instructions:  Your physician recommends that you continue on your current medications as directed. Please refer to the Current Medication list given to you today.  *If you need a refill on your cardiac medications before your next appointment, please call your pharmacy*  Lab Work: CBC, CMET, TSH today  Testing/Procedures: Chest Xray  Follow-Up: At [facility], you and your health needs are our priority.  As part of our continuing mission to provide you with exceptional heart care, our providers are all part of one team.  This team includes your primary Cardiologist (physician) and Advanced Practice Providers or APPs (Physician Assistants and Nurse Practitioners) who all work together to provide you with the care you need, when you need it.  Your next appointment:   6 month(s)  Provider:   Dr. Alvis Ba  We recommend signing up for the patient portal called "MyChart".  Sign up information is provided on this After Visit Summary.  MyChart is used to connect with patients for Virtual Visits (Telemedicine).  Patients are able to view lab/test results, encounter notes, upcoming appointments, etc.  Non-urgent messages can be sent to your provider as well.   To learn more about what you can do with MyChart, go to [email or web address].

## 2023-12-04 NOTE — Telephone Encounter (Signed)
 Pharmacy Patient Advocate Encounter  Insurance verification completed.   The patient is insured through [facility]   Ran test claim for XARELTO. Currently a quantity of 30 is a 30 day supply and the co-pay is $293.31  . The current 30 day co-pay is, $293.31.  No PA needed at this time.  This test claim was processed through [facility]- copay amounts may vary at other pharmacies due to pharmacy/plan contracts, or as the patient moves through the different stages of their insurance plan.

## 2023-12-04 NOTE — Progress Notes (Unsigned)
 Office Visit    Patient Name: Kenneth Nixon Date of Encounter: 12/04/2023  Primary Care Provider:  Myna Asal, PA-C Primary Cardiologist:  Bridgette Campus, MD  Chief Complaint    78 year old male with a history of paroxysmal atrial fibrillation, mitral valve regurgitation, aortic valve regurgitation, hypertension, hyperlipidemia and vertigo who presents for follow-up related to atrial fibrillation.  Past Medical History    No past medical history on file. No past surgical history on file.  Allergies  No Known Allergies   Labs/Other Studies Reviewed    The following studies were reviewed today:  Cardiac Studies & Procedures   ______________________________________________________________________________________________     ECHOCARDIOGRAM  ECHOCARDIOGRAM COMPLETE 11/08/2021  Narrative ECHOCARDIOGRAM REPORT    Patient Name:   Kenneth Nixon [facility] Date of Exam: 11/08/2021 Medical Rec #:  [id number]                   Height:       69.3 in Accession #:    [id number]                  Weight:       156.0 lb Date of Birth:  01-Dec-1945                   BSA:          1.864 m Patient Age:    75 years                    BP:           136/76 mmHg Patient Gender: M                           HR:           59 bpm. Exam Location:  [address]  Procedure: 2D Echo, 3D Echo, Cardiac Doppler, Color Doppler and Strain Analysis  Indications:    I48.91 Atrial fibrillation  History:        Patient has no prior history of Echocardiogram examinations. Risk Factors:Hypertension and Dyslipidemia. Alzheimer's disease.  Sonographer:    Verena Glaser BS, RDCS Referring Phys: [phone] Tomas Fountain [facility]  IMPRESSIONS   1. Left ventricular ejection fraction, by estimation, is 50 to 55%. The left ventricle has low normal function. The left ventricle has no regional wall motion abnormalities. Left ventricular diastolic parameters are consistent with Grade I  diastolic dysfunction (impaired relaxation). The average left ventricular global longitudinal strain is -20.1 %. The global longitudinal strain is normal. 2. Right ventricular systolic function is normal. The right ventricular size is normal. There is normal pulmonary artery systolic pressure. 3. Left atrial size was mildly dilated. 4. Right atrial size was mildly dilated. 5. Mild mitral valve regurgitation. 6. Aortic valve regurgitation is mild to moderate. Aortic valve sclerosis is present, with no evidence of aortic valve stenosis. 7. The inferior vena cava is dilated in size with >50% respiratory variability, suggesting right atrial pressure of 8 mmHg.  Comparison(s): No prior Echocardiogram.  FINDINGS Left Ventricle: Left ventricular ejection fraction, by estimation, is 50 to 55%. The left ventricle has low normal function. The left ventricle has no regional wall motion abnormalities. The average left ventricular global longitudinal strain is -20.1 %. The global longitudinal strain is normal. The left ventricular internal cavity size was normal in size. There is no left ventricular hypertrophy. Left ventricular diastolic parameters are consistent with Grade I diastolic dysfunction (impaired  relaxation).  Right Ventricle: The right ventricular size is normal. No increase in right ventricular wall thickness. Right ventricular systolic function is normal. There is normal pulmonary artery systolic pressure. The tricuspid regurgitant velocity is 2.38 m/s, and with an assumed right atrial pressure of 8 mmHg, the estimated right ventricular systolic pressure is 30.7 mmHg.  Left Atrium: Left atrial size was mildly dilated.  Right Atrium: Right atrial size was mildly dilated.  Pericardium: There is no evidence of pericardial effusion.  Mitral Valve: Mild mitral valve regurgitation.  Tricuspid Valve: Tricuspid valve regurgitation is trivial.  Aortic Valve: Aortic valve regurgitation is mild  to moderate. Aortic regurgitation PHT measures 714 msec. Aortic valve sclerosis is present, with no evidence of aortic valve stenosis.  Pulmonic Valve: The pulmonic valve was not well visualized. Pulmonic valve regurgitation is not visualized.  Aorta: The aortic root and ascending aorta are structurally normal, with no evidence of dilitation.  Venous: The inferior vena cava is dilated in size with greater than 50% respiratory variability, suggesting right atrial pressure of 8 mmHg.  IAS/Shunts: No atrial level shunt detected by color flow Doppler.   LEFT VENTRICLE PLAX 2D LVIDd:         4.90 cm   Diastology LVIDs:         3.50 cm   LV e' medial:    9.14 cm/s LV PW:         0.80 cm   LV E/e' medial:  6.7 LV IVS:        0.80 cm   LV e' lateral:   10.00 cm/s LVOT diam:     2.20 cm   LV E/e' lateral: 6.1 LV SV:         86 LV SV Index:   46        2D Longitudinal Strain LVOT Area:     3.80 cm  2D Strain GLS (A2C):   -19.6 % 2D Strain GLS (A3C):   -22.5 % 2D Strain GLS (A4C):   -18.3 % 2D Strain GLS Avg:     -20.1 %  3D Volume EF: 3D EF:        56 % LV EDV:       123 ml LV ESV:       55 ml LV SV:        69 ml  RIGHT VENTRICLE             IVC RV Basal diam:  3.80 cm     IVC diam: 2.50 cm RV S prime:     12.20 cm/s TAPSE (M-mode): 2.6 cm RVSP:           30.7 mmHg  LEFT ATRIUM             Index        RIGHT ATRIUM           Index LA diam:        2.60 cm 1.39 cm/m   RA Pressure: 8.00 mmHg LA Vol (A2C):   45.8 ml 24.57 ml/m  RA Area:     19.20 cm LA Vol (A4C):   29.6 ml 15.88 ml/m  RA Volume:   52.90 ml  28.37 ml/m LA Biplane Vol: 38.1 ml 20.44 ml/m AORTIC VALVE LVOT Vmax:   85.30 cm/s LVOT Vmean:  58.500 cm/s LVOT VTI:    0.227 m AI PHT:      714 msec  AORTA Ao Root diam: 3.90 cm Ao Asc diam:  3.90 cm  MITRAL VALVE               TRICUSPID VALVE TR Peak grad:   22.7 mmHg MV Decel Time: 310 msec    TR Vmax:        238.00 cm/s MV E velocity: 61.00 cm/s  Estimated RAP:   8.00 mmHg MV A velocity: 86.40 cm/s  RVSP:           30.7 mmHg MV E/A ratio:  0.71 SHUNTS Systemic VTI:  0.23 m Systemic Diam: 2.20 cm  Alois Arnt Electronically signed by Alois Arnt Signature Date/Time: 11/08/2021/6:41:53 PM    Final    MONITORS  LONG TERM MONITOR (3-14 DAYS) 11/28/2021  Narrative Agree with ziopatch below. 35% burden of atrial fibrillation. Triggered sinus rhythm  Patch Wear Time:  14 days and 0 hours (2023-04-16T19:15:28-0400 to 2023-04-30T19:15:32-0400)  Patient had a min HR of 43 bpm, max HR of 180 bpm, and avg HR of 69 bpm. Predominant underlying rhythm was Sinus Rhythm. Atrial Fibrillation/Flutter occurred (35% burden), ranging from 47-180 bpm (avg of 86 bpm), the longest lasting 4 hours 58 mins with an avg rate of 74 bpm. Atrial Fibrillation/Flutter was present at activation of device. Atrial Fibrillation/Flutter was present at de-activation of device. Isolated SVEs were rare (<1.0%), SVE Couplets were rare (<1.0%), and SVE Triplets were rare (<1.0%). Isolated VEs were rare (<1.0%), and no VE Couplets or VE Triplets were present. Ventricular Trigeminy was present.       ______________________________________________________________________________________________     Recent Labs: No results found for requested labs within last 365 days.  Recent Lipid Panel No results found for: "CHOL", "TRIG", "HDL", "CHOLHDL", "VLDL", "LDLCALC", "LDLDIRECT"  History of Present Illness    78 year old male with the above past medical history including paroxysmal atrial fibrillation, mitral valve regurgitation, aortic valve regurgitation, hypertension, hyperlipidemia and vertigo.  He was diagnosed with atrial fibrillation in [place].  He was started on amiodarone and Eliquis.  He established with Dr. Alois Arnt in 10/2021. 14-day ZIO in 10/2021 revealed predominantly underlying sinus rhythm, 35% A-fib burden.  Echocardiogram in 10/2021 showed EF 50 to 55%, low normal  LV function, no RWMA, G1 DD, normal RV systolic function, mild mitral valve regurgitation, mild to moderate aortic valve regurgitation.  He was last seen in office on 09/10/2022 and was doing well.  He was maintaining sinus rhythm.    He presents today for follow-up accompanied by his daughter and in person Spanish interpreter.  Since his last visit he has done well from a cardiac standpoint.  He denies any chest pain, dyspnea, palpitations, dizziness, edema, PND, orthopnea, weight gain.  He exercises regularly, walking frequently.  Overall, he reports feeling well.  He notes that he stopped taking his Eliquis approximate 1-1/2 months ago as his insurance told him it would cost $800 for 1 month.  He is unable to afford this.  He scheduled an appointment to discuss alternatives to Eliquis.  He continues to take amiodarone 200 mg every other day.  Will check chest x-ray, CBC, TSH, CMET for amiodarone monitoring.  He does have a history of MR/TR, will defer echocardiogram at this time. Given he is asymptomatic.  Consider repeat echocardiogram as clinically indicated.  History of dementia, on Namenda .  Following with neurology.  Follow-up in 6 months, sooner if needed.  Patient was notified that his Eliquis until meeting deductible with his commercial insurance would cost to 297.34 a month.  Patient given co-pay card in office today.  Refill sent to his  pharmacy. Paroxysmal atrial fibrillation: Valvular heart disease: Hypertension: Hyperlipidemia: Disposition:  Home Medications    Current Outpatient Medications  Medication Sig Dispense Refill   amiodarone (PACERONE) 200 MG tablet Take 200 mg by mouth daily.     amLODipine  (NORVASC ) 5 MG tablet Take 1 tablet (5 mg total) by mouth daily. 90 tablet 6   apixaban (ELIQUIS) 5 MG TABS tablet Take 5 mg by mouth 2 (two) times daily.     Ascorbic Acid (VITAMIN C) 100 MG tablet Take 100 mg by mouth daily.     Cholecalciferol (VITAMIN D3 PO) Take by mouth.      divalproex  (DEPAKOTE ) 125 MG DR tablet Take 1 tablet (125 mg total) by mouth 2 (two) times daily. 60 tablet 11   Glucosamine HCl (GLUCOSAMINE PO) Take by mouth.     memantine  (NAMENDA ) 10 MG tablet Take 1 tablet (10 mg total) by mouth 2 (two) times daily. 60 tablet 3   mirtazapine  (REMERON ) 7.5 MG tablet Take 1 tablet (7.5 mg total) by mouth at bedtime. 30 tablet 0   No current facility-administered medications for this visit.     Review of Systems    ***.  All other systems reviewed and are otherwise negative except as noted above.    Physical Exam    VS:  There were no vitals taken for this visit. , BMI There is no height or weight on file to calculate BMI.     GEN: Well nourished, well developed, in no acute distress. HEENT: normal. Neck: Supple, no JVD, carotid bruits, or masses. Cardiac: RRR, no murmurs, rubs, or gallops. No clubbing, cyanosis, edema.  Radials/DP/PT 2+ and equal bilaterally.  Respiratory:  Respirations regular and unlabored, clear to auscultation bilaterally. GI: Soft, nontender, nondistended, BS + x 4. MS: no deformity or atrophy. Skin: warm and dry, no rash. Neuro:  Strength and sensation are intact. Psych: Normal affect.  Accessory Clinical Findings    ECG personally reviewed by me today -    - no acute changes.   No results found for: "WBC", "HGB", "HCT", "MCV", "PLT" No results found for: "CREATININE", "BUN", "NA", "K", "CL", "CO2" No results found for: "ALT", "AST", "GGT", "ALKPHOS", "BILITOT" No results found for: "CHOL", "HDL", "LDLCALC", "LDLDIRECT", "TRIG", "CHOLHDL"  No results found for: "HGBA1C"  Assessment & Plan    1.  ***  No BP recorded.  {Refresh Note OR Click here to enter BP  :1}***   Jude Norton, NP 12/04/2023, 6:12 AM

## 2023-12-05 LAB — COMPREHENSIVE METABOLIC PANEL WITH GFR
ALT: 22 IU/L (ref 0–44)
AST: 21 IU/L (ref 0–40)
Albumin: 4.4 g/dL (ref 3.8–4.8)
Alkaline Phosphatase: 105 IU/L (ref 44–121)
BUN/Creatinine Ratio: 22 (ref 10–24)
BUN: 28 mg/dL — ABNORMAL HIGH (ref 8–27)
Bilirubin Total: 0.2 mg/dL (ref 0.0–1.2)
CO2: 20 mmol/L (ref 20–29)
Calcium: 9.1 mg/dL (ref 8.6–10.2)
Chloride: 107 mmol/L — ABNORMAL HIGH (ref 96–106)
Creatinine, Ser: 1.26 mg/dL (ref 0.76–1.27)
Globulin, Total: 2.1 g/dL (ref 1.5–4.5)
Glucose: 72 mg/dL (ref 70–99)
Potassium: 4.3 mmol/L (ref 3.5–5.2)
Sodium: 143 mmol/L (ref 134–144)
Total Protein: 6.5 g/dL (ref 6.0–8.5)
eGFR: 58 mL/min/{1.73_m2} — ABNORMAL LOW (ref >59)

## 2023-12-05 LAB — CBC
Hematocrit: 40.4 % (ref 37.5–51.0)
Hemoglobin: 13.6 g/dL (ref 13.0–17.7)
MCH: 34 pg — ABNORMAL HIGH (ref 26.6–33.0)
MCHC: 33.7 g/dL (ref 31.5–35.7)
MCV: 101 fL — ABNORMAL HIGH (ref 79–97)
Platelets: 182 10*3/uL (ref 150–450)
RBC: 4 x10E6/uL — ABNORMAL LOW (ref 4.14–5.80)
RDW: 12.8 % (ref 11.6–15.4)
WBC: 5.6 10*3/uL (ref 3.4–10.8)

## 2023-12-05 LAB — TSH: TSH: 2.3 u[IU]/mL (ref 0.450–4.500)

## 2023-12-06 ENCOUNTER — Encounter: Payer: Self-pay | Admitting: Nurse Practitioner

## 2023-12-08 ENCOUNTER — Ambulatory Visit: Payer: Self-pay

## 2023-12-12 ENCOUNTER — Other Ambulatory Visit: Payer: Self-pay | Admitting: Internal Medicine

## 2023-12-14 ENCOUNTER — Other Ambulatory Visit: Payer: Self-pay

## 2023-12-14 ENCOUNTER — Ambulatory Visit (HOSPITAL_COMMUNITY)
Admission: RE | Admit: 2023-12-14 | Discharge: 2023-12-14 | Disposition: A | Discharge status: Home / Self Care | Source: Ambulatory Visit | Attending: Internal Medicine | Admitting: Internal Medicine

## 2023-12-14 ENCOUNTER — Other Ambulatory Visit (HOSPITAL_COMMUNITY): Payer: Self-pay

## 2023-12-14 DIAGNOSIS — I480 Paroxysmal atrial fibrillation: Secondary | ICD-10-CM | POA: Diagnosis not present

## 2023-12-14 DIAGNOSIS — K449 Diaphragmatic hernia without obstruction or gangrene: Secondary | ICD-10-CM | POA: Diagnosis not present

## 2023-12-14 MED ORDER — AMLODIPINE BESYLATE 5 MG PO TABS
5.0000 mg | ORAL_TABLET | Freq: Every day | ORAL | 3 refills | Status: AC
  Filled 2023-12-14: qty 90, 90d supply, fill #0
  Filled 2024-03-12: qty 90, 90d supply, fill #1

## 2023-12-21 ENCOUNTER — Other Ambulatory Visit: Payer: Self-pay | Admitting: Physician Assistant

## 2023-12-26 ENCOUNTER — Other Ambulatory Visit: Payer: Self-pay | Admitting: Physician Assistant

## 2024-01-06 ENCOUNTER — Encounter: Payer: Self-pay | Admitting: Physician Assistant

## 2024-01-06 ENCOUNTER — Ambulatory Visit: Payer: BC Managed Care – PPO | Admitting: Physician Assistant

## 2024-01-06 VITALS — BP 145/88 | HR 63 | Resp 20 | Ht 68.0 in | Wt 152.0 lb

## 2024-01-06 DIAGNOSIS — F03918 Unspecified dementia, unspecified severity, with other behavioral disturbance: Secondary | ICD-10-CM

## 2024-01-06 NOTE — Progress Notes (Signed)
 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 RH male Spanish speaking with a history of hypertension, prostate cancer, PAF, hyperlipidemia, bradycardia, history of dementia likely due to Alzheimer's disease seen today in follow up for memory loss. Patient is currently on 10 mg twice daily.  Cognitive decline is noted, unable to add another agent, due to his history of bradycardia; pulse continues to be borderline.  He tries to remain active and participate in his ADLs, needing more assistance than prior.  Sundowning is well-controlled with Depakote  125 mg at night.  He attends adult day program, enjoying the facility.   Follow up in 6 months. Continue memantine  10 mg twice daily, side effects discussed Continue Depakote  125 mg nightly Recommend good control of her cardiovascular risk factors Continue to control mood as per PCP, continue Eliquis  Continue attending the adult day program to increase socialization, and for cognitive stimulation     Subjective:    This patient is accompanied in the office by his wife who supplements the history.  Previous records as well as any outside records available were reviewed prior to todays visit. Patient was last seen on 07/07/2023    Any changes in memory since last visit?  May be worse than before.  He likes to read extensively although the same bulk over and over because he forgets he has read it before .  He likes to play music to pass the time.  He has more difficulty with comprehension.  He follows his wife everywhere in the house . repeats oneself?  Endorsed, very frequently Disoriented when walking into a room? Denies at home, only when they are going somewhere   Leaving objects?  He hoards stuff as before then cannot find it.  He is more disorganized.  Lost the bag in the bathroom's [employer].      Wandering behavior?  His wife is  always with him to prevent him from wandering off. Any personality changes since last visit?  He is less filter, more childish than before .  As before, he is anxious at times, still burns, become very irritable and he is very scared of the dark.  He is paranoid about his belongings, thinking that people are going to steal it. Any worsening depression?:  Denies.   Hallucinations or paranoia?  Denies.   Seizures? denies    Any sleep changes?  Denies vivid dreams, REM behavior or sleepwalking   Sleep apnea?   Denies.   Any hygiene concerns?  Any to be told to bathe because he seems that he already did.  He does not know how to put his own clothing. Independent of bathing and dressing?  Getting his assistance to get dressed. Does the patient needs help with medications?  Wife is in charge   Who is in charge of the finances?  Wife is in charge     Any changes in appetite?  denies.  As before, he favors sweets.  He relates that he ate and he eats again    Patient have trouble swallowing? Denies.   Does the patient cook? No Any headaches?   denies   Any vision changes?  Denies Chronic back pain  denies   Ambulates with difficulty? Denies.  Denies Recent falls or head injuries? Denies.     Unilateral weakness, numbness or tingling? denies   Any tremors?  Denies   Any anosmia?  Denies  Any incontinence of urine?  Endorsed, he has prolonged urination, but no big stream due to prostate issues.   Any bowel dysfunction?   Denies      Patient lives with his wife  Does the patient drive? No longer drives       Initial Visit 08/29/21 The patient is seen in neurologic consultation at the request of Myna Asal, PA-C for the evaluation of memory.  The patient is accompanied by his wife who supplements the history. This is a 78 y.o. year old RH  male who recently moved in December 2022 from [place] to live with his son.he carries a diagnosis of dementia for at least 2 years.  Initially he reports  that his memory problems become around 40 years ago, when he was having difficulties remembering conversations or recent events.  His memory issues became worse 2 years ago, for which his neurologist started him on memantine  10 mg daily, which he tolerated well.  Since arriving here, his son noticed worsening of the patient's memory.  His wife states that he repeats himself frequently, and has shown decrease organization skills.  He used to be very organized, but over the last 2 years this is less .  He denies being disoriented when walking into the room, or leaving objects in unusual places.  He ambulates without difficulty, denies any recent falls or head injuries.  He no longer drives.  His mood t is good, denies depression, but at times becomes very anxious, missing [place].  He likes to read extensively especially before going to sleep.  He sleeps well, his wife states that sometimes he talks in his sleep, but there is no other REM behavior, or sleepwalking.  No hallucinations.  She states that although no frank paranoia, he has a fear about being alone.  There are no hygiene concerns, he enjoys bathing twice a day.  However, he needs to be told what goals first when he has to get dressed.  He is wife prepares the medications in a pillbox.  They do the finances together.  His appetite is good, denies trouble swallowing.  He used to cook, but he no longer does so.  He does not use the stove, but he does leave the fridge open on the faucet on especially over the last 6 months.  Denies headaches, double vision, dizziness, focal numbness or tingling, unilateral weakness, tremors or anosmia. No history of seizures.  He reports some urge incontinence, denies constipation or diarrhea. Denies OSA, ETOH or Tobacco. Family History remarkable for paternal and with Alzheimer's disease at 6.        Any changes in memory since last visit?  Worse-wife says.  He likes to read extensively the same book over and  over because he forgets that he had read that before .  He likes to play music to past the time, he has more difficulty with comprehension than prior. He follows his wife everywhere in the house. He has become very childish.  repeats oneself?  Endorsed, very frequently  Disoriented when walking into a room?  When going to another house he may be very disoriented.    Leaving objects? Endorsed, hoards more, placing for ex glasses in the freezer.    Wandering behavior?  His wife always him to prevent him from wandering off. Any worsening depression?:  Denies.   Hallucinations or paranoia? He is more verbal, with less filter, has more childish attitude than before.  He is stressed and anxious,stubborn, very irritable,  very scared of the dark. Paranoid about his belongings, he thinks people will steal from him  Seizures? denies    Any sleep changes?  Sleeps well.  Denies vivid dreams, REM behavior or sleepwalking   Sleep apnea?   Denies.   Any hygiene concerns?  He needs reminder to bathe, because he thinks he already did.  He does not know how to put on his own clothing. Independent of bathing and dressing?  He needs assistance to get dressed.  Does the patient needs help with medications?  Wife is in charge   Who is in charge of the finances?  Wife is in charge     Any changes in appetite? Eats poorly, favors sweets.He forgets he ate and eats again.     Patient have trouble swallowing? Denies.   Does the patient cook? No Any headaches?   denies   Chronic back pain  denies   Ambulates with difficulty? Denies.    Recent falls or head injuries? denies     Unilateral weakness, numbness or tingling? denies   Any tremors?  Denies   Any anosmia?  Denies   Any incontinence of urine?  He has prolonged urination due to prostate issues. Any bowel dysfunction?   Denies      Patient lives with his wife  Does the patient drive?  Has not driven since coming to the [place]       Initial Visit 08/29/21  The patient is seen in neurologic consultation at the request of Myna Asal, PA-C for the evaluation of memory.  The patient is accompanied by his wife who supplements the history. This is a 78 y.o. year old RH  male who recently moved in December 2022 from [place] to live with his son.he carries a diagnosis of dementia for at least 2 years.  Initially he reports that his memory problems become around 40 years ago, when he was having difficulties remembering conversations or recent events.  His memory issues became worse 2 years ago, for which his neurologist started him on memantine  10 mg daily, which he tolerated well.  Since arriving here, his son noticed worsening of the patient's memory.  His wife states that he repeats himself frequently, and has shown decrease organization skills.  He used to be very organized, but over the last 2 years this is less .  He denies being disoriented when walking into the room, or leaving objects in unusual places.  He ambulates without difficulty, denies any recent falls or head injuries.  He no longer drives.  His mood t is good, denies depression, but at times becomes very anxious, missing [place].  He likes to read extensively especially before going to sleep.  He sleeps well, his wife states that sometimes he talks in his sleep, but there is no other REM behavior, or sleepwalking.  No hallucinations.  She states that although no frank paranoia, he has a fear about being alone.  There are no hygiene concerns, he enjoys bathing twice a day.  However, he needs to be told what goals first when he has to get dressed.  He is wife prepares the medications in a pillbox.  They do the finances together.  His appetite is good, denies trouble swallowing.  He used to cook, but he no longer does so.  He does not use the stove, but he does leave the fridge open on the faucet on especially over the last 6 months.  Denies headaches, double vision, dizziness, focal numbness or  tingling, unilateral weakness, tremors or anosmia. No history of seizures.  He reports some urge incontinence, denies constipation or diarrhea. Denies OSA, ETOH or Tobacco. Family History remarkable for paternal and with Alzheimer's disease at 47.   PREVIOUS MEDICATIONS:   CURRENT MEDICATIONS:  Outpatient Encounter Medications as of 01/06/2024  Medication Sig   amiodarone (PACERONE) 200 MG tablet Take 200 mg by mouth daily.   amLODipine  (NORVASC ) 5 MG tablet Take 1 tablet (5 mg total) by mouth daily.   apixaban  (ELIQUIS ) 5 MG TABS tablet Take 1 tablet (5 mg total) by mouth 2 (two) times daily.   Ascorbic Acid (VITAMIN C) 100 MG tablet Take 100 mg by mouth daily.   Cholecalciferol (VITAMIN D3 PO) Take by mouth.   divalproex  (DEPAKOTE ) 125 MG DR tablet Take 1 tablet (125 mg total) by mouth 2 (two) times daily.   Glucosamine HCl (GLUCOSAMINE PO) Take by mouth.   memantine  (NAMENDA ) 10 MG tablet Take 1 tablet by mouth twice daily   mirtazapine  (REMERON ) 7.5 MG tablet Take 1 tablet (7.5 mg total) by mouth at bedtime.   No facility-administered encounter medications on file as of 01/06/2024.       06/24/2022    9:00 AM  MMSE - Mini Mental State Exam  Orientation to time 0  Orientation to Place 1  Registration 3  Attention/ Calculation 3  Recall 0  Language- name 2 objects 2  Language- repeat 1  Language- follow 3 step command 3  Language- read & follow direction 1  Write a sentence 1  Copy design 0  Total score 15      09/16/2021   10:00 AM  Montreal Cognitive Assessment   Visuospatial/ Executive (0/5) 0  Naming (0/3) 3  Attention: Read list of digits (0/2) 1  Attention: Read list of letters (0/1) 1  Attention: Serial 7 subtraction starting at 100 (0/3) 3  Language: Repeat phrase (0/2) 1  Language : Fluency (0/1) 0  Abstraction (0/2) 1  Delayed Recall (0/5) 2  Orientation (0/6) 2  Total 14  Adjusted Score (based on education) 14    Objective:     PHYSICAL EXAMINATION:     VITALS:   Vitals:   01/06/24 1056  BP: (!) 145/88  Pulse: 63  Resp: 20  SpO2: 95%  Weight: 152 lb (68.9 kg)  Height: 5' 8 (1.727 m)    GEN:  The patient appears stated age and is in NAD. HEENT:  Normocephalic, atraumatic.   Neurological examination:  General: NAD, well-groomed, appears stated age. Orientation: The patient is alert. Oriented to person, not to place and date Cranial nerves: There is good facial symmetry.The speech is fluent and clear. No aphasia or dysarthria. Fund of knowledge is reduced. Recent and remote memory are impaired. Attention and concentration are reduced. Able to name objects and unable to repeat phrases.  Hearing is intact to conversational tone.   Sensation: Sensation is intact to light touch throughout Motor: Strength is at least antigravity x4. DTR's 2/4 in UE/LE     Movement examination: Tone: There is normal tone in the UE/LE Abnormal movements:  no tremor.  No myoclonus.  No asterixis.   Coordination:  There is no decremation with RAM's. Normal finger to nose  Gait and Station: The patient has no difficulty arising out of a deep-seated chair without the use of the hands. The patient's stride length is good.  Gait is cautious and narrow.    Thank you for allowing us  the opportunity to  participate in the care of this nice patient. Please do not hesitate to contact us  for any questions or concerns.   Total time spent on today's visit was 23 minutes dedicated to this patient today, preparing to see patient, examining the patient, ordering tests and/or medications and counseling the patient, documenting clinical information in the EHR or other health record, independently interpreting results and communicating results to the patient/family, discussing treatment and goals, answering patient's questions and coordinating care.  Cc:  Myna Asal, PA-C  Tex Filbert 01/06/2024 11:15 AM

## 2024-01-06 NOTE — Patient Instructions (Addendum)
 It was a pleasure to see you today at our office.   Recommendations:  Follow up in 6 meses Continue Memantine  10 mg twice daily.  Continue Depakote  125 mg 1 tab at night, may increase to 1 tab 125 mg twice a day for sundowning, hallucinations  .  Siga in contacto con su medico primario      Whom to call:  Memory  decline, memory medications: Call out office [phone]   For psychiatric meds, mood meds: Please have your primary care physician manage these medications.       For assessment of decision of mental capacity and competency:  Call Dr. Laverne Potter, geriatric psychiatrist at [phone]  For guidance in geriatric dementia issues please call [facility] [phone]  For guidance regarding [facility] contact Forrestine Ike, Social Worker tel: [phone]  If you have any severe symptoms of a stroke, or other severe issues such as confusion,severe chills or fever, etc call 911 or go to the ER as you may need to be evaluate further    RECOMMENDATIONS FOR ALL PATIENTS WITH MEMORY PROBLEMS: 1. Continue to exercise (Recommend 30 minutes of walking everyday, or 3 hours every week) 2. Increase social interactions - continue going to [place] and enjoy social gatherings with friends and family 3. Eat healthy, avoid fried foods and eat more fruits and vegetables 4. Maintain adequate blood pressure, blood sugar, and blood cholesterol level. Reducing the risk of stroke and cardiovascular disease also helps promoting better memory. 5. Avoid stressful situations. Live a simple life and avoid aggravations. Organize your time and prepare for the next day in anticipation. 6. Sleep well, avoid any interruptions of sleep and avoid any distractions in the bedroom that may interfere with adequate sleep quality 7. Avoid sugar, avoid sweets as there is a strong link between excessive sugar intake, diabetes, and cognitive impairment We discussed the  Mediterranean diet, which has been shown to help patients reduce the risk of progressive memory disorders and reduces cardiovascular risk. This includes eating fish, eat fruits and green leafy vegetables, nuts like almonds and hazelnuts, walnuts, and also use olive oil. Avoid fast foods and fried foods as much as possible. Avoid sweets and sugar as sugar use has been linked to worsening of memory function.  There is always a concern of gradual progression of memory problems. If this is the case, then we may need to adjust level of care according to patient needs. Support, both to the patient and caregiver, should then be put into place.    The Alzheimer's Association is here all day, every day for people facing Alzheimer's disease through our free 24/7 Helpline: 802-066-3536. The Helpline provides reliable information and support to all those who need assistance, such as individuals living with memory loss, Alzheimer's or other dementia, caregivers, health care professionals and the public.  Our highly trained and knowledgeable staff can help you with: Understanding memory loss, dementia and Alzheimer's  Medications and other treatment options  General information about aging and brain health  Skills to provide quality care and to find the best care from professionals  Legal, financial and living-arrangement decisions Our Helpline also features: Confidential care consultation provided by master's level clinicians who can help with decision-making support, crisis assistance and education on issues families face every day  Help in a caller's preferred language using our translation service that features more than 200 languages and dialects  Referrals to local community programs, services and ongoing support  FALL PRECAUTIONS: Be cautious when walking. Scan the area for obstacles that may increase the risk of trips and falls. When getting up in the mornings, sit up at the edge of the bed for a  few minutes before getting out of bed. Consider elevating the bed at the head end to avoid drop of blood pressure when getting up. Walk always in a well-lit room (use night lights in the walls). Avoid area rugs or power cords from appliances in the middle of the walkways. Use a walker or a cane if necessary and consider physical therapy for balance exercise. Get your eyesight checked regularly.  FINANCIAL OVERSIGHT: Supervision, especially oversight when making financial decisions or transactions is also recommended.  HOME SAFETY: Consider the safety of the kitchen when operating appliances like stoves, microwave oven, and blender. Consider having supervision and share cooking responsibilities until no longer able to participate in those. Accidents with firearms and other hazards in the house should be identified and addressed as well.   ABILITY TO BE LEFT ALONE: If patient is unable to contact 911 operator, consider using LifeLine, or when the need is there, arrange for someone to stay with patients. Smoking is a fire hazard, consider supervision or cessation. Risk of wandering should be assessed by caregiver and if detected at any point, supervision and safe proof recommendations should be instituted.  MEDICATION SUPERVISION: Inability to self-administer medication needs to be constantly addressed. Implement a mechanism to ensure safe administration of the medications.    Mediterranean Diet A Mediterranean diet refers to food and lifestyle choices that are based on the traditions of countries located on the [employer]. This way of eating has been shown to help prevent certain conditions and improve outcomes for people who have chronic diseases, like kidney disease and heart disease. What are tips for following this plan? Lifestyle  Cook and eat meals together with your family, when possible. Drink enough fluid to keep your urine clear or pale yellow. Be physically active every day. This  includes: Aerobic exercise like running or swimming. Leisure activities like gardening, walking, or housework. Get 7-8 hours of sleep each night. If recommended by your health care provider, drink red wine in moderation. This means 1 glass a day for nonpregnant women and 2 glasses a day for men. A glass of wine equals 5 oz (150 mL). Reading food labels  Check the serving size of packaged foods. For foods such as rice and pasta, the serving size refers to the amount of cooked product, not dry. Check the total fat in packaged foods. Avoid foods that have saturated fat or trans fats. Check the ingredients list for added sugars, such as corn syrup. Shopping  At the grocery store, buy most of your food from the areas near the walls of the store. This includes: Fresh fruits and vegetables (produce). Grains, beans, nuts, and seeds. Some of these may be available in unpackaged forms or large amounts (in bulk). Fresh seafood. Poultry and eggs. Low-fat dairy products. Buy whole ingredients instead of prepackaged foods. Buy fresh fruits and vegetables in-season from local farmers markets. Buy frozen fruits and vegetables in resealable bags. If you do not have access to quality fresh seafood, buy precooked frozen shrimp or canned fish, such as tuna, salmon, or sardines. Buy small amounts of raw or cooked vegetables, salads, or olives from the deli or salad bar at your store. Stock your pantry so you always have certain foods on hand, such as olive oil, canned tuna,  canned tomatoes, rice, pasta, and beans. Cooking  Cook foods with extra-virgin olive oil instead of using butter or other vegetable oils. Have meat as a side dish, and have vegetables or grains as your main dish. This means having meat in small portions or adding small amounts of meat to foods like pasta or stew. Use beans or vegetables instead of meat in common dishes like chili or lasagna. Experiment with different cooking methods. Try  roasting or broiling vegetables instead of steaming or sauteing them. Add frozen vegetables to soups, stews, pasta, or rice. Add nuts or seeds for added healthy fat at each meal. You can add these to yogurt, salads, or vegetable dishes. Marinate fish or vegetables using olive oil, lemon juice, garlic, and fresh herbs. Meal planning  Plan to eat 1 vegetarian meal one day each week. Try to work up to 2 vegetarian meals, if possible. Eat seafood 2 or more times a week. Have healthy snacks readily available, such as: Vegetable sticks with hummus. Greek yogurt. Fruit and nut trail mix. Eat balanced meals throughout the week. This includes: Fruit: 2-3 servings a day Vegetables: 4-5 servings a day Low-fat dairy: 2 servings a day Fish, poultry, or lean meat: 1 serving a day Beans and legumes: 2 or more servings a week Nuts and seeds: 1-2 servings a day Whole grains: 6-8 servings a day Extra-virgin olive oil: 3-4 servings a day Limit red meat and sweets to only a few servings a month What are my food choices? Mediterranean diet Recommended Grains: Whole-grain pasta. Brown rice. Bulgar wheat. Polenta. Couscous. Whole-wheat bread. Dwyane Glad. Vegetables: Artichokes. Beets. Broccoli. Cabbage. Carrots. Eggplant. Green beans. Chard. Kale. Spinach. Onions. Leeks. Peas. Squash. Tomatoes. Peppers. Radishes. Fruits: Apples. Apricots. Avocado. Berries. Bananas. Cherries. Dates. Figs. Grapes. Lemons. Melon. Oranges. Peaches. Plums. Pomegranate. Meats and other protein foods: Beans. Almonds. Sunflower seeds. Pine nuts. Peanuts. Cod. Salmon. Scallops. Shrimp. Tuna. Tilapia. Clams. Oysters. Eggs. Dairy: Low-fat milk. Cheese. Greek yogurt. Beverages: Water. Red wine. Herbal tea. Fats and oils: Extra virgin olive oil. Avocado oil. Grape seed oil. Sweets and desserts: [place] yogurt with honey. Baked apples. Poached pears. Trail mix. Seasoning and other foods: Basil. Cilantro. Coriander. Cumin. Mint.  Parsley. Sage. Rosemary. Tarragon. Garlic. Oregano. Thyme. Pepper. Balsalmic vinegar. Tahini. Hummus. Tomato sauce. Olives. Mushrooms. Limit these Grains: Prepackaged pasta or rice dishes. Prepackaged cereal with added sugar. Vegetables: Deep fried potatoes (french fries). Fruits: Fruit canned in syrup. Meats and other protein foods: Beef. Pork. Lamb. Poultry with skin. Hot dogs. Helene Loader. Dairy: Ice cream. Sour cream. Whole milk. Beverages: Juice. Sugar-sweetened soft drinks. Beer. Liquor and spirits. Fats and oils: Butter. Canola oil. Vegetable oil. Beef fat (tallow). Lard. Sweets and desserts: Cookies. Cakes. Pies. Candy. Seasoning and other foods: Mayonnaise. Premade sauces and marinades. The items listed may not be a complete list. Talk with your dietitian about what dietary choices are right for you. Summary The Mediterranean diet includes both food and lifestyle choices. Eat a variety of fresh fruits and vegetables, beans, nuts, seeds, and whole grains. Limit the amount of red meat and sweets that you eat. Talk with your health care provider about whether it is safe for you to drink red wine in moderation. This means 1 glass a day for nonpregnant women and 2 glasses a day for men. A glass of wine equals 5 oz (150 mL). This information is not intended to replace advice given to you by your health care provider. Make sure you discuss any questions you have with  your health care provider. Document Released: 03/06/2016 Document Revised: 04/08/2016 Document Reviewed: 03/06/2016 Elsevier Interactive Patient Education  2017 [employer].

## 2024-01-10 DIAGNOSIS — N390 Urinary tract infection, site not specified: Secondary | ICD-10-CM | POA: Diagnosis not present

## 2024-01-10 DIAGNOSIS — G309 Alzheimer's disease, unspecified: Secondary | ICD-10-CM | POA: Diagnosis not present

## 2024-01-10 DIAGNOSIS — R300 Dysuria: Secondary | ICD-10-CM | POA: Diagnosis not present

## 2024-03-02 DIAGNOSIS — R3989 Other symptoms and signs involving the genitourinary system: Secondary | ICD-10-CM | POA: Diagnosis not present

## 2024-03-02 DIAGNOSIS — C61 Malignant neoplasm of prostate: Secondary | ICD-10-CM | POA: Diagnosis not present

## 2024-03-04 DIAGNOSIS — C61 Malignant neoplasm of prostate: Secondary | ICD-10-CM | POA: Diagnosis not present

## 2024-05-23 DIAGNOSIS — M6530 Trigger finger, unspecified finger: Secondary | ICD-10-CM | POA: Diagnosis not present

## 2024-05-30 ENCOUNTER — Other Ambulatory Visit: Payer: Self-pay | Admitting: Physician Assistant

## 2024-06-03 DIAGNOSIS — R809 Proteinuria, unspecified: Secondary | ICD-10-CM | POA: Diagnosis not present

## 2024-06-03 DIAGNOSIS — B029 Zoster without complications: Secondary | ICD-10-CM | POA: Diagnosis not present

## 2024-06-03 DIAGNOSIS — D7589 Other specified diseases of blood and blood-forming organs: Secondary | ICD-10-CM | POA: Diagnosis not present

## 2024-06-03 DIAGNOSIS — N182 Chronic kidney disease, stage 2 (mild): Secondary | ICD-10-CM | POA: Diagnosis not present

## 2024-07-10 NOTE — Progress Notes (Signed)
 Kenneth Nixon is a very pleasant 78 y.o. 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 Patient is currently on   memantine  10 mg bid (bradycardia)***.  This patient is accompanied in the office by his wife*** who supplements the history.  Previous records as well as any outside records available were reviewed prior to todays visit. Patient was last seen on 01/06/24***. Memory is **. MMSE today is  /30. Patient is able to participate on ADLs to his ability needing more assistnace than prior  and continues to drive without difficulties. Sundowning is controlled with Depakote  125 mg at night.  He attends [employer] during the week.    Follow up in 6 months Continue Memantine  10 mg twice daily. Side effects were discussed  Continue Depakote  daily for sundowning [employer] attending [employer].  Recommend good control of cardiovascular risk factors.   ***   Discussed the use of AI scribe software for clinical note transcription with the patient, who gave verbal consent to proceed.  History of Present Illness   Any changes in memory since last visit?  May be worse than before.  He likes to read extensively although the same bulk over and over because he forgets he has read it before .  He likes to play music to pass the time.  He has more difficulty with comprehension.  He follows his wife everywhere in the house . repeats oneself?  Endorsed, very frequently Disoriented when walking into a room? Denies at home, only when they are going somewhere   Leaving objects?  He hoards stuff as before then cannot find it.  He is more disorganized.  Lost the bag in the bathroom's [employer].      Wandering behavior?  His wife is always with him to prevent him from wandering off. Any personality changes since last visit?  He is less filter, more  childish than before .  As before, he is anxious at times, still burns, become very irritable and he is very scared of the dark.  He is paranoid about his belongings, thinking that people are going to steal it. Any worsening depression?:  Denies.   Hallucinations or paranoia?  Denies.   Seizures? denies    Any sleep changes?  Denies vivid dreams, REM behavior or sleepwalking   Sleep apnea?   Denies.   Any hygiene concerns?  Any to be told to bathe because he seems that he already did.  He does not know how to put his own clothing. Independent of bathing and dressing?  Getting his assistance to get dressed. Does the patient needs help with medications?  Wife is in charge   Who is in charge of the finances?  Wife is in charge     Any changes in appetite?  denies.  As before, he favors sweets.  He relates that he ate and he eats again    Patient have trouble swallowing? Denies.   Does the patient cook? No Any headaches?   denies   Any vision changes?  Denies Chronic back pain  denies   Ambulates with difficulty? Denies.  Denies Recent falls or head injuries? Denies.     Unilateral weakness, numbness or tingling? denies   Any tremors?  Denies   Any anosmia?  Denies   Any incontinence of urine?  Endorsed, he has prolonged urination, but no big  stream due to prostate issues.   Any bowel dysfunction?   Denies      Patient lives with his wife  Does the patient drive? No longer drives       Initial Visit 08/29/21 The patient is seen in neurologic consultation at the request of [name], PA-C for the evaluation of memory.  The patient is accompanied by his wife who supplements the history. This is a 78 y.o. year old RH  male who recently moved in December 2022 from [place] to live with his son.he carries a diagnosis of dementia for at least 2 years.  Initially he reports that his memory problems become around 40 years ago, when he was having difficulties remembering conversations or recent  events.  His memory issues became worse 2 years ago, for which his neurologist started him on memantine  10 mg daily, which he tolerated well.  Since arriving here, his son noticed worsening of the patient's memory.  His wife states that he repeats himself frequently, and has shown decrease organization skills.  He used to be very organized, but over the last 2 years this is less .  He denies being disoriented when walking into the room, or leaving objects in unusual places.  He ambulates without difficulty, denies any recent falls or head injuries.  He no longer drives.  His mood t is good, denies depression, but at times becomes very anxious, missing [place].  He likes to read extensively especially before going to sleep.  He sleeps well, his wife states that sometimes he talks in his sleep, but there is no other REM behavior, or sleepwalking.  No hallucinations.  She states that although no frank paranoia, he has a fear about being alone.  There are no hygiene concerns, he enjoys bathing twice a day.  However, he needs to be told what goals first when he has to get dressed.  He is wife prepares the medications in a pillbox.  They do the finances together.  His appetite is good, denies trouble swallowing.  He used to cook, but he no longer does so.  He does not use the stove, but he does leave the fridge open on the faucet on especially over the last 6 months.  Denies headaches, double vision, dizziness, focal numbness or tingling, unilateral weakness, tremors or anosmia. No history of seizures.  He reports some urge incontinence, denies constipation or diarrhea. Denies OSA, ETOH or Tobacco. Family History remarkable for paternal and with Alzheimer's disease at 19.        Any changes in memory since last visit?  Worse-wife says.  He likes to read extensively the same book over and over because he forgets that he had read that before .  He likes to play music to past the time, he has more difficulty  with comprehension than prior. He follows his wife everywhere in the house. He has become very childish.  repeats oneself?  Endorsed, very frequently  Disoriented when walking into a room?  When going to another house he may be very disoriented.    Leaving objects? Endorsed, hoards more, placing for ex glasses in the freezer.    Wandering behavior?  His wife always him to prevent him from wandering off. Any worsening depression?:  Denies.   Hallucinations or paranoia? He is more verbal, with less filter, has more childish attitude than before.  He is stressed and anxious,stubborn, very irritable, very scared of the dark. Paranoid about his belongings, he thinks people will  steal from him  Seizures? denies    Any sleep changes?  Sleeps well.  Denies vivid dreams, REM behavior or sleepwalking   Sleep apnea?   Denies.   Any hygiene concerns?  He needs reminder to bathe, because he thinks he already did.  He does not know how to put on his own clothing. Independent of bathing and dressing?  He needs assistance to get dressed.  Does the patient needs help with medications?  Wife is in charge   Who is in charge of the finances?  Wife is in charge     Any changes in appetite? Eats poorly, favors sweets.He forgets he ate and eats again.     Patient have trouble swallowing? Denies.   Does the patient cook? No Any headaches?   denies   Chronic back pain  denies   Ambulates with difficulty? Denies.    Recent falls or head injuries? denies     Unilateral weakness, numbness or tingling? denies   Any tremors?  Denies   Any anosmia?  Denies   Any incontinence of urine?  He has prolonged urination due to prostate issues. Any bowel dysfunction?   Denies      Patient lives with his wife  Does the patient drive?  Has not driven since coming to the [place]       Initial Visit 08/29/21 The patient is seen in neurologic consultation at the request of [name], PA-C for the evaluation of memory.  The  patient is accompanied by his wife who supplements the history. This is a 78 y.o. year old RH  male who recently moved in December 2022 from [place] to live with his son.he carries a diagnosis of dementia for at least 2 years.  Initially he reports that his memory problems become around 40 years ago, when he was having difficulties remembering conversations or recent events.  His memory issues became worse 2 years ago, for which his neurologist started him on memantine  10 mg daily, which he tolerated well.  Since arriving here, his son noticed worsening of the patient's memory.  His wife states that he repeats himself frequently, and has shown decrease organization skills.  He used to be very organized, but over the last 2 years this is less .  He denies being disoriented when walking into the room, or leaving objects in unusual places.  He ambulates without difficulty, denies any recent falls or head injuries.  He no longer drives.  His mood t is good, denies depression, but at times becomes very anxious, missing [place].  He likes to read extensively especially before going to sleep.  He sleeps well, his wife states that sometimes he talks in his sleep, but there is no other REM behavior, or sleepwalking.  No hallucinations.  She states that although no frank paranoia, he has a fear about being alone.  There are no hygiene concerns, he enjoys bathing twice a day.  However, he needs to be told what goals first when he has to get dressed.  He is wife prepares the medications in a pillbox.  They do the finances together.  His appetite is good, denies trouble swallowing.  He used to cook, but he no longer does so.  He does not use the stove, but he does leave the fridge open on the faucet on especially over the last 6 months.  Denies headaches, double vision, dizziness, focal numbness or tingling, unilateral weakness, tremors or anosmia. No history of seizures.  He reports  some urge incontinence, denies  constipation or diarrhea. Denies OSA, ETOH or Tobacco. Family History remarkable for paternal and with Alzheimer's disease at 31.           06/24/2022    9:00 AM  MMSE - Mini Mental State Exam  Orientation to time 0  Orientation to Place 1  Registration 3  Attention/ Calculation 3  Recall 0  Language- name 2 objects 2  Language- repeat 1  Language- follow 3 step command 3  Language- read & follow direction 1  Write a sentence 1  Copy design 0  Total score 15      09/16/2021   10:00 AM  Montreal Cognitive Assessment   Visuospatial/ Executive (0/5) 0  Naming (0/3) 3  Attention: Read list of digits (0/2) 1  Attention: Read list of letters (0/1) 1  Attention: Serial 7 subtraction starting at 100 (0/3) 3  Language: Repeat phrase (0/2) 1  Language : Fluency (0/1) 0  Abstraction (0/2) 1  Delayed Recall (0/5) 2  Orientation (0/6) 2  Total 14  Adjusted Score (based on education) 14      Objective:    Neurological Exam:    VITALS:  There were no vitals filed for this visit.  GEN:  The patient appears stated age and is in NAD. HEENT:  Normocephalic, atraumatic.   Neurological examination:  General: NAD, well-groomed, appears stated age. Orientation: The patient is alert. Oriented to person,not to  place and date Cranial nerves: There is good facial symmetry.The speech is fluent and clear. No aphasia or dysarthria. Fund of knowledge is  reduced. Recent and remote memory are impaired. Attention and concentration are reduced. Able to name objects and repeat phrases.  Hearing is intact to conversational tone. *** Sensation: Sensation is intact to light touch throughout Motor: Strength is at least antigravity x4. DTR's 2/4 in UE/LE     Movement examination:  Tone: There is normal tone in the UE/LE Abnormal movements:  no tremor.  No myoclonus.  No asterixis.   Coordination:  There is no decremation with RAM's. Normal finger to nose  Gait and Station: The patient has  no*** difficulty arising out of a deep-seated chair without the use of the hands. The patient's stride length is good.  Gait is cautious and narrow.    Thank you for allowing us  the opportunity to participate in the care of this nice patient. Please do not hesitate to contact us  for any questions or concerns.   Total time spent on today's visit was *** minutes dedicated to this patient today, preparing to see patient, examining the patient, ordering tests and/or medications and counseling the patient, documenting clinical information in the EHR or other health record, independently interpreting results and communicating results to the patient/family, discussing treatment and goals, answering patient's questions and coordinating care.  Cc:  [name], PA-C  Camie Sevin 07/10/2024 4:05 PM

## 2024-07-11 ENCOUNTER — Encounter: Payer: Self-pay | Admitting: Physician Assistant

## 2024-07-11 ENCOUNTER — Ambulatory Visit: Admitting: Physician Assistant

## 2024-07-11 VITALS — BP 146/79 | HR 55 | Ht 68.0 in | Wt 155.0 lb

## 2024-07-11 DIAGNOSIS — F03918 Unspecified dementia, unspecified severity, with other behavioral disturbance: Secondary | ICD-10-CM | POA: Insufficient documentation

## 2024-07-11 MED ORDER — MEMANTINE HCL 10 MG PO TABS: 10.0000 mg | ORAL_TABLET | Freq: Two times a day (BID) | ORAL | 3 refills | Status: AC

## 2024-07-11 MED ORDER — DIVALPROEX SODIUM 250 MG PO DR TAB: 250.0000 mg | DELAYED_RELEASE_TABLET | Freq: Two times a day (BID) | ORAL | 3 refills | Status: AC

## 2024-07-11 NOTE — Patient Instructions (Signed)
 It was a pleasure to see you today at our office.   Recommendations:  Follow up in 6 meses Continue Memantine  10 mg twice daily.  Increase  Depakote250 mg 1 tab twice a day for sundowning, hallucinations  .  Siga in contacto con su medico primario      Whom to call:  Memory  decline, memory medications: Call out office [phone]   For psychiatric meds, mood meds: Please have your primary care physician manage these medications.       For assessment of decision of mental capacity and competency:  Call Dr. Rosaline Nine, geriatric psychiatrist at [phone]  For guidance in geriatric dementia issues please call [facility] [phone]  For guidance regarding [facility] contact Nat Hock, Social Worker tel: [phone]  If you have any severe symptoms of a stroke, or other severe issues such as confusion,severe chills or fever, etc call 911 or go to the ER as you may need to be evaluate further    RECOMMENDATIONS FOR ALL PATIENTS WITH MEMORY PROBLEMS: 1. Continue to exercise (Recommend 30 minutes of walking everyday, or 3 hours every week) 2. Increase social interactions - continue going to [place] and enjoy social gatherings with friends and family 3. Eat healthy, avoid fried foods and eat more fruits and vegetables 4. Maintain adequate blood pressure, blood sugar, and blood cholesterol level. Reducing the risk of stroke and cardiovascular disease also helps promoting better memory. 5. Avoid stressful situations. Live a simple life and avoid aggravations. Organize your time and prepare for the next day in anticipation. 6. Sleep well, avoid any interruptions of sleep and avoid any distractions in the bedroom that may interfere with adequate sleep quality 7. Avoid sugar, avoid sweets as there is a strong link between excessive sugar intake, diabetes, and cognitive impairment We discussed the Mediterranean diet, which has been shown to help  patients reduce the risk of progressive memory disorders and reduces cardiovascular risk. This includes eating fish, eat fruits and green leafy vegetables, nuts like almonds and hazelnuts, walnuts, and also use olive oil. Avoid fast foods and fried foods as much as possible. Avoid sweets and sugar as sugar use has been linked to worsening of memory function.  There is always a concern of gradual progression of memory problems. If this is the case, then we may need to adjust level of care according to patient needs. Support, both to the patient and caregiver, should then be put into place.    The [employer] is here all day, every day for people facing Alzheimers disease through our free 24/7 Helpline: [phone]. The Helpline provides reliable information and support to all those who need assistance, such as individuals living with memory loss, Alzheimer's or other dementia, caregivers, health care professionals and the public.  Our highly trained and knowledgeable staff can help you with: Understanding memory loss, dementia and Alzheimer's  Medications and other treatment options  General information about aging and brain health  Skills to provide quality care and to find the best care from professionals  Legal, financial and living-arrangement decisions Our Helpline also features: Confidential care consultation provided by master's level clinicians who can help with decision-making support, crisis assistance and education on issues families face every day  Help in a caller's preferred language using our translation service that features more than 200 languages and dialects  Referrals to local community programs, services and ongoing support     FALL PRECAUTIONS: Be cautious when walking. Scan the  area for obstacles that may increase the risk of trips and falls. When getting up in the mornings, sit up at the edge of the bed for a few minutes before getting out of bed. Consider  elevating the bed at the head end to avoid drop of blood pressure when getting up. Walk always in a well-lit room (use night lights in the walls). Avoid area rugs or power cords from appliances in the middle of the walkways. Use a walker or a cane if necessary and consider physical therapy for balance exercise. Get your eyesight checked regularly.  FINANCIAL OVERSIGHT: Supervision, especially oversight when making financial decisions or transactions is also recommended.  HOME SAFETY: Consider the safety of the kitchen when operating appliances like stoves, microwave oven, and blender. Consider having supervision and share cooking responsibilities until no longer able to participate in those. Accidents with firearms and other hazards in the house should be identified and addressed as well.   ABILITY TO BE LEFT ALONE: If patient is unable to contact 911 operator, consider using LifeLine, or when the need is there, arrange for someone to stay with patients. Smoking is a fire hazard, consider supervision or cessation. Risk of wandering should be assessed by caregiver and if detected at any point, supervision and safe proof recommendations should be instituted.  MEDICATION SUPERVISION: Inability to self-administer medication needs to be constantly addressed. Implement a mechanism to ensure safe administration of the medications.    Mediterranean Diet A Mediterranean diet refers to food and lifestyle choices that are based on the traditions of countries located on the [place]. This way of eating has been shown to help prevent certain conditions and improve outcomes for people who have chronic diseases, like kidney disease and heart disease. What are tips for following this plan? Lifestyle  Cook and eat meals together with your family, when possible. Drink enough fluid to keep your urine clear or pale yellow. Be physically active every day. This includes: Aerobic exercise like running or  swimming. Leisure activities like gardening, walking, or housework. Get 7-8 hours of sleep each night. If recommended by your health care provider, drink red wine in moderation. This means 1 glass a day for nonpregnant women and 2 glasses a day for men. A glass of wine equals 5 oz (150 mL). Reading food labels  Check the serving size of packaged foods. For foods such as rice and pasta, the serving size refers to the amount of cooked product, not dry. Check the total fat in packaged foods. Avoid foods that have saturated fat or trans fats. Check the ingredients list for added sugars, such as corn syrup. Shopping  At the grocery store, buy most of your food from the areas near the walls of the store. This includes: Fresh fruits and vegetables (produce). Grains, beans, nuts, and seeds. Some of these may be available in unpackaged forms or large amounts (in bulk). Fresh seafood. Poultry and eggs. Low-fat dairy products. Buy whole ingredients instead of prepackaged foods. Buy fresh fruits and vegetables in-season from local farmers markets. Buy frozen fruits and vegetables in resealable bags. If you do not have access to quality fresh seafood, buy precooked frozen shrimp or canned fish, such as tuna, salmon, or sardines. Buy small amounts of raw or cooked vegetables, salads, or olives from the deli or salad bar at your store. Stock your pantry so you always have certain foods on hand, such as olive oil, canned tuna, canned tomatoes, rice, pasta, and beans. Cooking  Cook foods with extra-virgin olive oil instead of using butter or other vegetable oils. Have meat as a side dish, and have vegetables or grains as your main dish. This means having meat in small portions or adding small amounts of meat to foods like pasta or stew. Use beans or vegetables instead of meat in common dishes like chili or lasagna. Experiment with different cooking methods. Try roasting or broiling vegetables instead of  steaming or sauteing them. Add frozen vegetables to soups, stews, pasta, or rice. Add nuts or seeds for added healthy fat at each meal. You can add these to yogurt, salads, or vegetable dishes. Marinate fish or vegetables using olive oil, lemon juice, garlic, and fresh herbs. Meal planning  Plan to eat 1 vegetarian meal one day each week. Try to work up to 2 vegetarian meals, if possible. Eat seafood 2 or more times a week. Have healthy snacks readily available, such as: Vegetable sticks with hummus. Greek yogurt. Fruit and nut trail mix. Eat balanced meals throughout the week. This includes: Fruit: 2-3 servings a day Vegetables: 4-5 servings a day Low-fat dairy: 2 servings a day Fish, poultry, or lean meat: 1 serving a day Beans and legumes: 2 or more servings a week Nuts and seeds: 1-2 servings a day Whole grains: 6-8 servings a day Extra-virgin olive oil: 3-4 servings a day Limit red meat and sweets to only a few servings a month What are my food choices? Mediterranean diet Recommended Grains: Whole-grain pasta. Brown rice. Bulgar wheat. Polenta. Couscous. Whole-wheat bread. [place] Made[place]. Vegetables: Artichokes. Beets. Broccoli. Cabbage. Carrots. Eggplant. Green beans. Chard. Kale. Spinach. Onions. Leeks. Peas. Squash. Tomatoes. Peppers. Radishes. Fruits: Apples. Apricots. Avocado. Berries. Bananas. Cherries. Dates. Figs. Grapes. Lemons. Melon. Oranges. Peaches. Plums. Pomegranate. Meats and other protein foods: Beans. Almonds. Sunflower seeds. Pine nuts. Peanuts. Cod. Salmon. Scallops. Shrimp. Tuna. Tilapia. Clams. Oysters. Eggs. Dairy: Low-fat milk. Cheese. Greek yogurt. Beverages: Water. Red wine. Herbal tea. Fats and oils: Extra virgin olive oil. Avocado oil. Grape seed oil. Sweets and desserts: Greek yogurt with honey. Baked apples. Poached pears. Trail mix. Seasoning and other foods: Basil. Cilantro. Coriander. Cumin. Mint. Parsley. Sage. Rosemary. Tarragon. Garlic.  Oregano. Thyme. Pepper. Balsalmic vinegar. Tahini. Hummus. Tomato sauce. Olives. Mushrooms. Limit these Grains: Prepackaged pasta or rice dishes. Prepackaged cereal with added sugar. Vegetables: Deep fried potatoes (french fries). Fruits: Fruit canned in syrup. Meats and other protein foods: Beef. Pork. Lamb. Poultry with skin. Hot dogs. [place]. Dairy: Ice cream. Sour cream. Whole milk. Beverages: Juice. Sugar-sweetened soft drinks. Beer. Liquor and spirits. Fats and oils: Butter. Canola oil. Vegetable oil. Beef fat (tallow). Lard. Sweets and desserts: Cookies. Cakes. Pies. Candy. Seasoning and other foods: Mayonnaise. Premade sauces and marinades. The items listed may not be a complete list. Talk with your dietitian about what dietary choices are right for you. Summary The Mediterranean diet includes both food and lifestyle choices. Eat a variety of fresh fruits and vegetables, beans, nuts, seeds, and whole grains. Limit the amount of red meat and sweets that you eat. Talk with your health care provider about whether it is safe for you to drink red wine in moderation. This means 1 glass a day for nonpregnant women and 2 glasses a day for men. A glass of wine equals 5 oz (150 mL). This information is not intended to replace advice given to you by your health care provider. Make sure you discuss any questions you have with your health care provider. Document Released: 03/06/2016 Document  Revised: 04/08/2016 Document Reviewed: 03/06/2016 Elsevier Interactive Patient Education  2017 [employer].

## 2025-01-11 ENCOUNTER — Ambulatory Visit: Payer: Self-pay | Admitting: Physician Assistant
